# Patient Record
Sex: Female | Born: 1980 | Race: Black or African American | Hispanic: No | Marital: Married | Smoking: Never smoker
Health system: Southern US, Community
[De-identification: ages and names within clinical notes are randomized; demographics above are authoritative.]

## PROBLEM LIST (undated history)

## (undated) ENCOUNTER — Inpatient Hospital Stay (HOSPITAL_COMMUNITY): Payer: Self-pay

## (undated) DIAGNOSIS — F329 Major depressive disorder, single episode, unspecified: Secondary | ICD-10-CM

## (undated) DIAGNOSIS — J45909 Unspecified asthma, uncomplicated: Secondary | ICD-10-CM

## (undated) DIAGNOSIS — O24419 Gestational diabetes mellitus in pregnancy, unspecified control: Secondary | ICD-10-CM

## (undated) DIAGNOSIS — F419 Anxiety disorder, unspecified: Secondary | ICD-10-CM

## (undated) DIAGNOSIS — Z124 Encounter for screening for malignant neoplasm of cervix: Secondary | ICD-10-CM

## (undated) DIAGNOSIS — I10 Essential (primary) hypertension: Secondary | ICD-10-CM

## (undated) DIAGNOSIS — T7840XA Allergy, unspecified, initial encounter: Secondary | ICD-10-CM

## (undated) DIAGNOSIS — E669 Obesity, unspecified: Secondary | ICD-10-CM

## (undated) DIAGNOSIS — F32A Depression, unspecified: Secondary | ICD-10-CM

## (undated) DIAGNOSIS — H9191 Unspecified hearing loss, right ear: Secondary | ICD-10-CM

## (undated) HISTORY — PX: DILATION AND CURETTAGE OF UTERUS: SHX78

## (undated) HISTORY — DX: Allergy, unspecified, initial encounter: T78.40XA

## (undated) HISTORY — DX: Encounter for screening for malignant neoplasm of cervix: Z12.4

## (undated) HISTORY — DX: Obesity, unspecified: E66.9

---
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
## 2009-12-26 ENCOUNTER — Emergency Department (HOSPITAL_COMMUNITY): Admission: EM | Admit: 2009-12-26 | Discharge: 2009-12-26 | Payer: Self-pay | Admitting: Emergency Medicine

## 2010-05-30 LAB — URINE MICROSCOPIC-ADD ON

## 2010-05-30 LAB — URINALYSIS, ROUTINE W REFLEX MICROSCOPIC
Glucose, UA: NEGATIVE mg/dL
Ketones, ur: NEGATIVE mg/dL
Leukocytes, UA: NEGATIVE
Nitrite: NEGATIVE
Protein, ur: NEGATIVE mg/dL

## 2010-11-13 ENCOUNTER — Emergency Department (HOSPITAL_COMMUNITY)
Admission: EM | Admit: 2010-11-13 | Discharge: 2010-11-13 | Disposition: A | Discharge status: Home / Self Care | Payer: Self-pay | Attending: Emergency Medicine | Admitting: Emergency Medicine

## 2010-11-13 ENCOUNTER — Emergency Department (HOSPITAL_COMMUNITY): Payer: Self-pay

## 2010-11-13 DIAGNOSIS — R12 Heartburn: Secondary | ICD-10-CM | POA: Insufficient documentation

## 2010-11-13 DIAGNOSIS — R071 Chest pain on breathing: Secondary | ICD-10-CM | POA: Insufficient documentation

## 2010-11-13 DIAGNOSIS — Z79899 Other long term (current) drug therapy: Secondary | ICD-10-CM | POA: Insufficient documentation

## 2010-11-13 DIAGNOSIS — F341 Dysthymic disorder: Secondary | ICD-10-CM | POA: Insufficient documentation

## 2010-11-13 DIAGNOSIS — K219 Gastro-esophageal reflux disease without esophagitis: Secondary | ICD-10-CM | POA: Insufficient documentation

## 2010-11-13 DIAGNOSIS — R1013 Epigastric pain: Secondary | ICD-10-CM | POA: Insufficient documentation

## 2010-11-13 DIAGNOSIS — R110 Nausea: Secondary | ICD-10-CM | POA: Insufficient documentation

## 2010-11-13 LAB — COMPREHENSIVE METABOLIC PANEL
ALT: 20 U/L (ref 0–35)
BUN: 9 mg/dL (ref 6–23)
CO2: 23 mEq/L (ref 19–32)
Calcium: 9.3 mg/dL (ref 8.4–10.5)
Creatinine, Ser: 0.7 mg/dL (ref 0.50–1.10)
GFR calc Af Amer: >60 mL/min (ref >60)
GFR calc non Af Amer: >60 mL/min (ref >60)
Glucose, Bld: 85 mg/dL (ref 70–99)
Sodium: 139 mEq/L (ref 135–145)

## 2010-11-13 LAB — LIPASE, BLOOD: Lipase: 30 U/L (ref 11–59)

## 2010-11-13 LAB — DIFFERENTIAL
Eosinophils Relative: 0 % (ref 0–5)
Lymphocytes Relative: 20 % (ref 12–46)
Lymphs Abs: 1.9 10*3/uL (ref 0.7–4.0)
Monocytes Absolute: 0.6 10*3/uL (ref 0.1–1.0)
Monocytes Relative: 6 % (ref 3–12)
Neutro Abs: 7 10*3/uL (ref 1.7–7.7)

## 2010-11-13 LAB — CBC
HCT: 40.2 % (ref 36.0–46.0)
Hemoglobin: 14.6 g/dL (ref 12.0–15.0)
MCH: 31.4 pg (ref 26.0–34.0)
MCHC: 36.3 g/dL — ABNORMAL HIGH (ref 30.0–36.0)
MCV: 86.5 fL (ref 78.0–100.0)
RDW: 11.9 % (ref 11.5–15.5)

## 2010-11-13 MED ORDER — IOHEXOL 300 MG/ML  SOLN
100.0000 mL | Freq: Once | INTRAMUSCULAR | Status: AC | PRN
  Administered 2010-11-13: 100 mL via INTRAVENOUS

## 2013-02-02 IMAGING — CT CT ANGIO CHEST
2 of 7 series · 19 of 46 positions shown · IV contrast (APPLIED)
Comparison: 11/13/2010

CLINICAL DATA: Chest pain, shortness of breath.

CT ANGIOGRAPHY CHEST WITH CONTRAST
TECHNIQUE: Multidetector CT imaging of the chest was performed
using the standard protocol during bolus administration of
intravenous contrast.  Multiplanar CT image reconstructions
including MIPs were obtained to evaluate the vascular anatomy.
Contrast:  100 ml Omnipaque 300 IV.

[Series 8: pulm embolism 1.0 b25f thin · axial · 0.69mm/px · z∈[-294,-54]mm · 16 of 265 slices shown]
[im 13/265  lung]
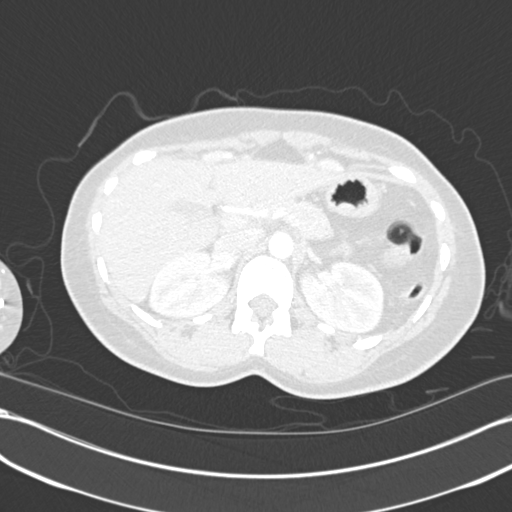
[im 25/265  soft-tissue]
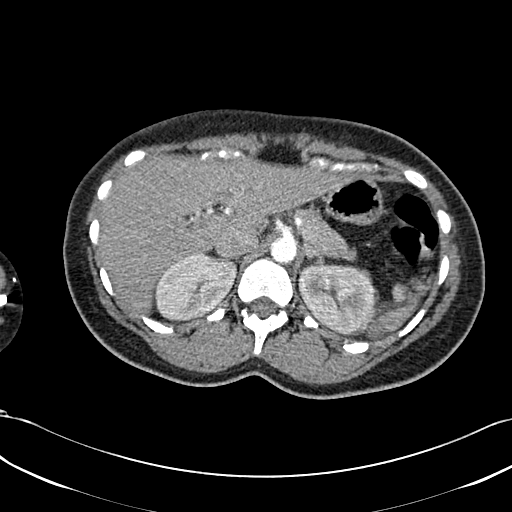
[im 49/265  lung]
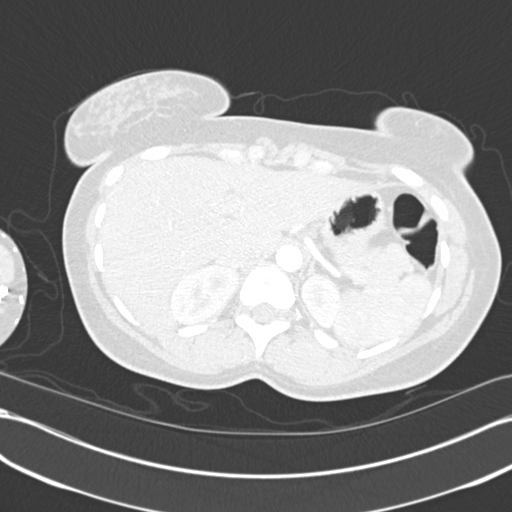
[im 61/265  soft-tissue]
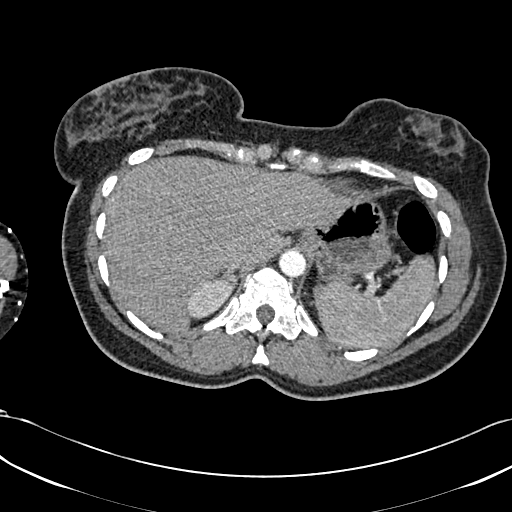
[im 73/265  lung]
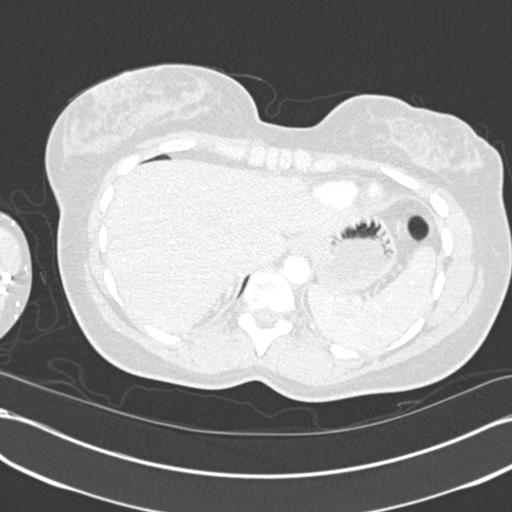
[im 97/265  soft-tissue]
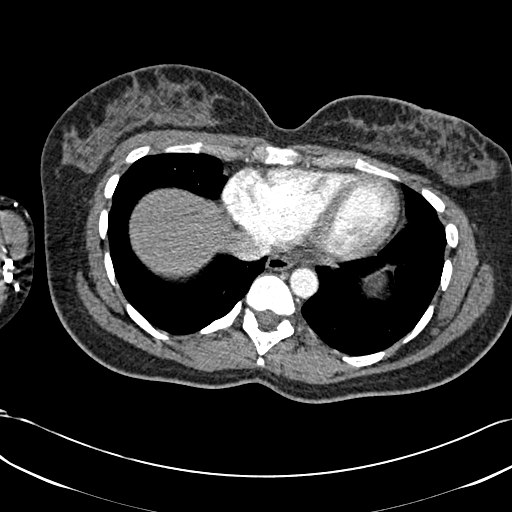
[im 109/265  lung]
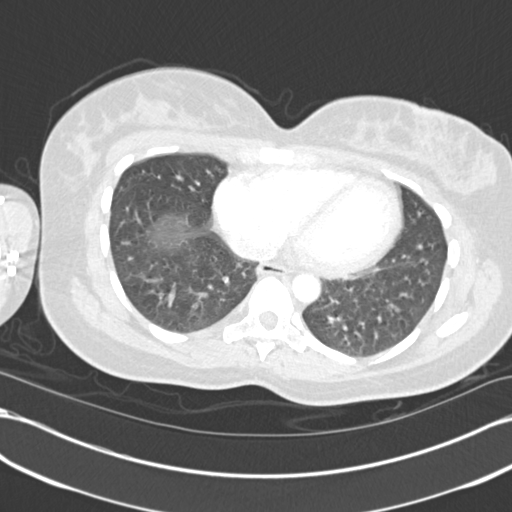
[im 121/265  soft-tissue]
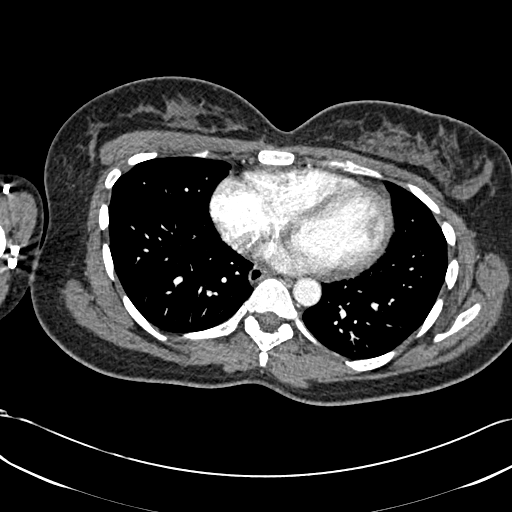
[im 145/265  lung]
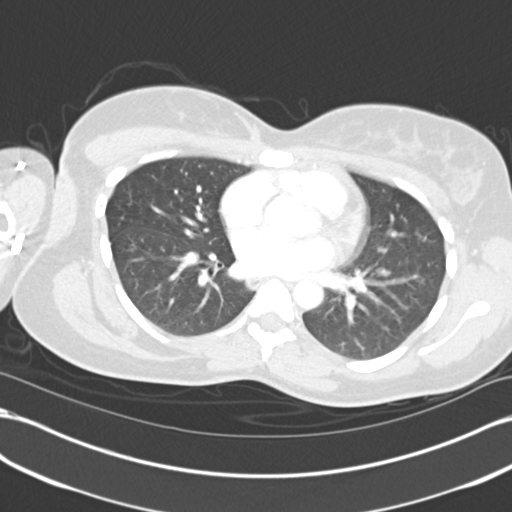
[im 157/265  soft-tissue]
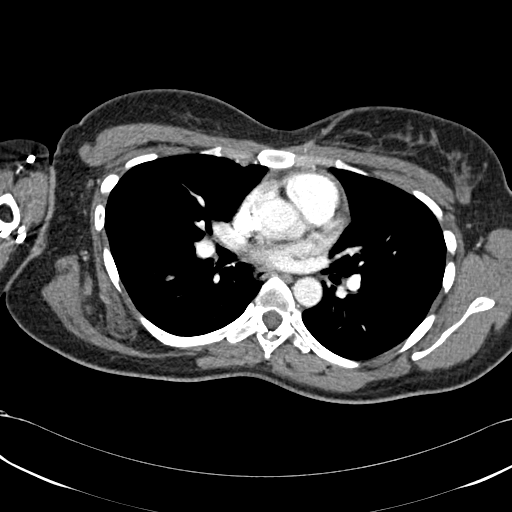
[im 169/265  lung]
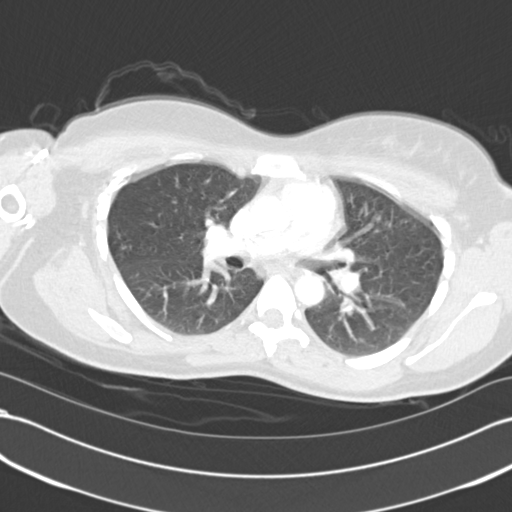
[im 193/265  soft-tissue]
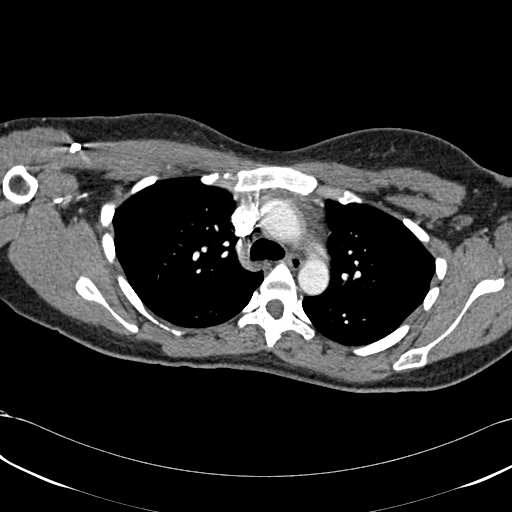
[im 205/265  lung]
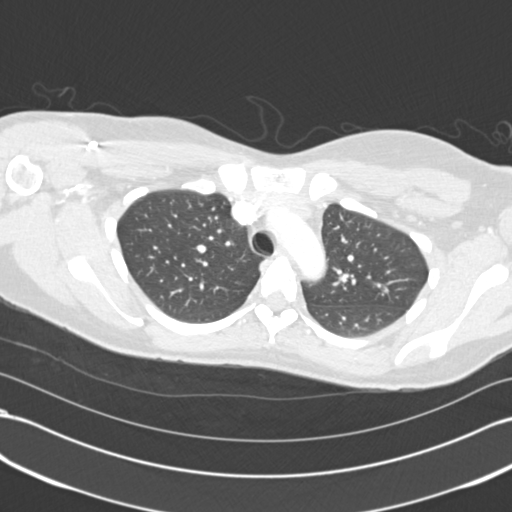
[im 217/265  soft-tissue]
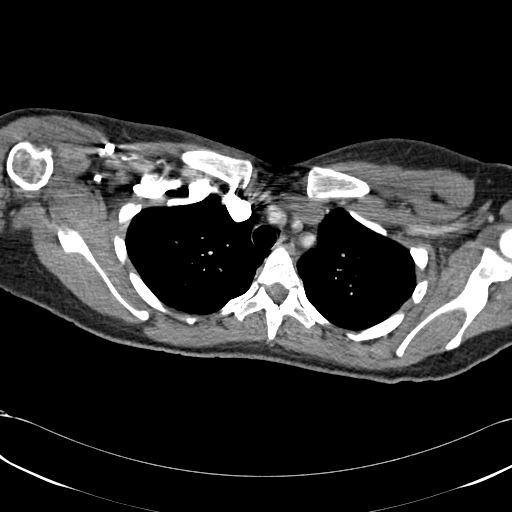
[im 241/265  lung]
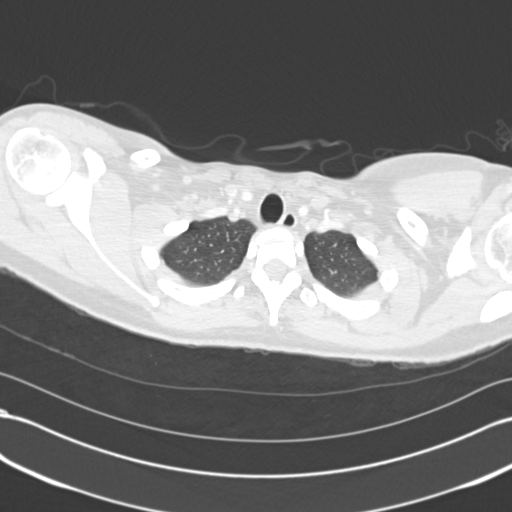
[im 253/265  soft-tissue]
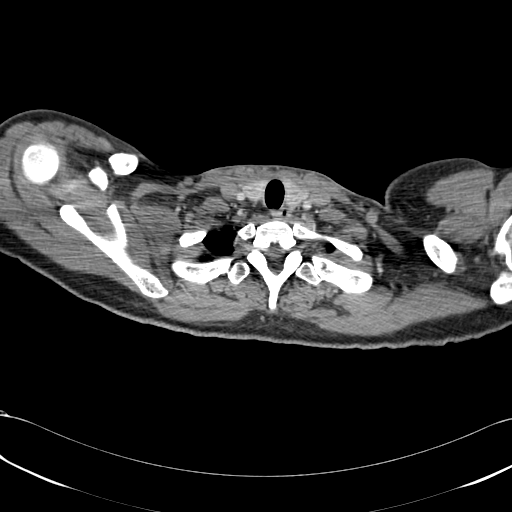

[Series 9: pulm embolism 2.0 spo thin · coronal · 0.69mm/px · 3 of 102 slices shown]
[im 26/102  soft-tissue]
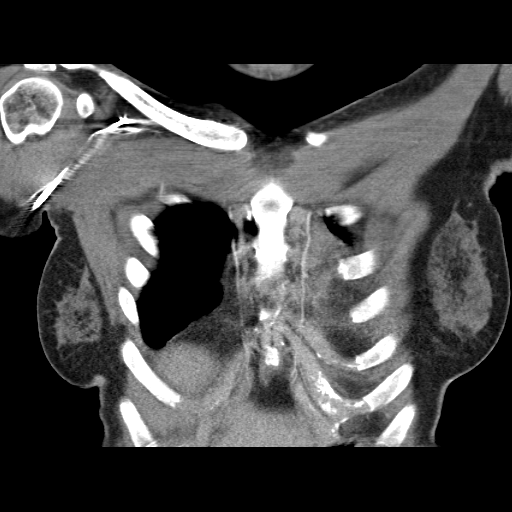
[im 51/102  soft-tissue]
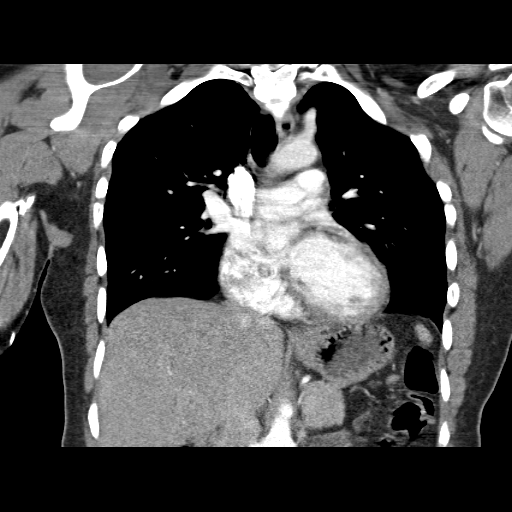
[im 76/102  soft-tissue]
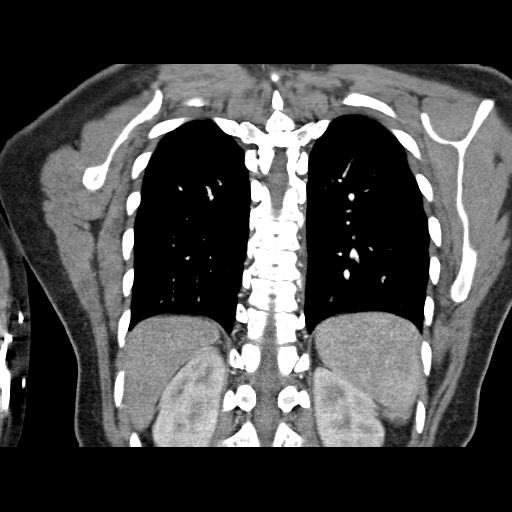

[19 of 46 positions shown; findings below may reference images not displayed]

FINDINGS: No filling defects in the pulmonary arteries to suggest
pulmonary emboli. Heart is normal size. Aorta is normal caliber. No
mediastinal, hilar, or axillary adenopathy.  Visualized thyroid and
chest wall soft tissues unremarkable. Lungs are clear.  No focal
airspace opacities or suspicious nodules.  No effusions. Imaging
into the upper abdomen shows no acute findings.

Review of the MIP images confirms the above findings.
IMPRESSION: No evidence of pulmonary embolus.  No acute findings.

## 2015-05-02 DIAGNOSIS — I10 Essential (primary) hypertension: Secondary | ICD-10-CM | POA: Insufficient documentation

## 2016-04-21 ENCOUNTER — Emergency Department (HOSPITAL_COMMUNITY)
Admission: EM | Admit: 2016-04-21 | Discharge: 2016-04-21 | Disposition: A | Discharge status: Home / Self Care | Payer: 59 | Attending: Emergency Medicine | Admitting: Emergency Medicine

## 2016-04-21 ENCOUNTER — Emergency Department (HOSPITAL_COMMUNITY): Payer: 59

## 2016-04-21 ENCOUNTER — Encounter (HOSPITAL_COMMUNITY): Payer: Self-pay | Admitting: Emergency Medicine

## 2016-04-21 DIAGNOSIS — R0789 Other chest pain: Secondary | ICD-10-CM | POA: Insufficient documentation

## 2016-04-21 DIAGNOSIS — R002 Palpitations: Secondary | ICD-10-CM | POA: Diagnosis present

## 2016-04-21 DIAGNOSIS — F419 Anxiety disorder, unspecified: Secondary | ICD-10-CM | POA: Insufficient documentation

## 2016-04-21 DIAGNOSIS — R079 Chest pain, unspecified: Secondary | ICD-10-CM

## 2016-04-21 LAB — BASIC METABOLIC PANEL
ANION GAP: 12 (ref 5–15)
BUN: 8 mg/dL (ref 6–20)
CHLORIDE: 99 mmol/L — AB (ref 101–111)
CO2: 24 mmol/L (ref 22–32)
Calcium: 9.2 mg/dL (ref 8.9–10.3)
Creatinine, Ser: 0.82 mg/dL (ref 0.44–1.00)
GFR calc Af Amer: >60 mL/min (ref >60)
GFR calc non Af Amer: >60 mL/min (ref >60)
GLUCOSE: 142 mg/dL — AB (ref 65–99)
POTASSIUM: 2.9 mmol/L — AB (ref 3.5–5.1)
Sodium: 135 mmol/L (ref 135–145)

## 2016-04-21 LAB — I-STAT TROPONIN, ED
TROPONIN I, POC: 0 ng/mL (ref 0.00–0.08)
Troponin i, poc: 0 ng/mL (ref 0.00–0.08)

## 2016-04-21 LAB — CBC
HEMATOCRIT: 36.9 % (ref 36.0–46.0)
HEMOGLOBIN: 12.9 g/dL (ref 12.0–15.0)
MCH: 29.9 pg (ref 26.0–34.0)
MCHC: 35 g/dL (ref 30.0–36.0)
MCV: 85.4 fL (ref 78.0–100.0)
Platelets: 276 10*3/uL (ref 150–400)
RBC: 4.32 MIL/uL (ref 3.87–5.11)
RDW: 12.5 % (ref 11.5–15.5)
WBC: 9.3 10*3/uL (ref 4.0–10.5)

## 2016-04-21 LAB — MAGNESIUM: MAGNESIUM: 1.8 mg/dL (ref 1.7–2.4)

## 2016-04-21 LAB — I-STAT BETA HCG BLOOD, ED (MC, WL, AP ONLY): HCG, QUANTITATIVE: 761.5 m[IU]/mL — AB (ref <5)

## 2016-04-21 MED ORDER — POTASSIUM CHLORIDE CRYS ER 20 MEQ PO TBCR: 20.0000 meq | EXTENDED_RELEASE_TABLET | Freq: Two times a day (BID) | ORAL | 0 refills | Status: DC

## 2016-04-21 MED ORDER — POTASSIUM CHLORIDE 10 MEQ/100ML IV SOLN
10.0000 meq | Freq: Once | INTRAVENOUS | Status: DC
  Filled 2016-04-21: qty 100

## 2016-04-21 MED ORDER — POTASSIUM CHLORIDE CRYS ER 20 MEQ PO TBCR
40.0000 meq | EXTENDED_RELEASE_TABLET | Freq: Once | ORAL | Status: AC
  Administered 2016-04-21: 40 meq via ORAL
  Filled 2016-04-21: qty 2

## 2016-04-21 NOTE — ED Notes (Signed)
Lab called for blood draw.

## 2016-04-21 NOTE — ED Triage Notes (Signed)
[facility] from [facility] [employer] [facility] with c/p palpations/CP starting approx 1 hour ago.  Pt states she has a hx of anxiety and is unsure if this is a panic attack.  She reports miscarriage several weeks ago with N/V associated with it but the CP is new.  Non radiation.  Took 325 mg aspirin PTA.  NAD, A&O.

## 2016-04-21 NOTE — ED Notes (Signed)
PT ambulates to restroom without difficulty. Steady gait

## 2016-04-21 NOTE — ED Notes (Signed)
This nurse attempts troponin draw without success.

## 2016-04-21 NOTE — ED Notes (Signed)
PT reports pain is now 2/10. PT reports pain has calmed significantly since arrival to ED. PT taken a glass of water. No further requests at this time. PT appears calm.

## 2016-04-21 NOTE — ED Notes (Signed)
Joselyn Glassmanyler PA made aware that IV could not be established and that PT is in hall bed and does not have access to central telemetry. He requests that PO potassium be given at this time.

## 2016-04-21 NOTE — ED Notes (Signed)
Liz BeachGabe, RN attempts IV without success

## 2016-04-21 NOTE — ED Notes (Signed)
Lab at bedside

## 2016-04-21 NOTE — ED Notes (Addendum)
Care handoff to [place], [place]. She is aware of potassium orders and will follow up with Joselyn Glassmanyler, PA about IV potassium

## 2016-04-21 NOTE — ED Notes (Signed)
Marcelino DusterMichelle RN made aware of IV team consult and IV potassium.

## 2016-04-21 NOTE — ED Provider Notes (Signed)
[facility]-EMERGENCY DEPT Provider Note   CSN: [id number] Arrival date & time: 04/21/16  1411     History   Chief Complaint Chief Complaint  Patient presents with  . Palpitations  . Anxiety    HPI Megan Fields is a 36 y.o. female.  36 year old African-American female with no significant past medical history presents to the ED today with complaints of palpitation and anxiety. Patient presents from work by EMS. Approximately 12 PM this afternoon patient developed substernal chest pain. She describes it as pressure. Lasted for approximately 5 seconds. Was not associated with exertion. Was not pleuritic in nature. The pain did not radiate. She states that she then had a 2 out of 10 feeling of "uncomfort" in her chest. The pain did not radiate. Patient states that she began to hyperventilate and have clammy hands. Which has since resolved. Patient states that she was not diaphoretic or short of breath with the episode. States that she has been nauseous but that is baseline since her miscarriage. Patient is currently going through a miscarriage at this time. She is followed by OB/GYN. She has weekly beta hCGs drawn. Patient states that last week her beta hCG was 2400. She endorses vaginal bleeding that is baseline for her. Patient was given 325 aspirin in route by EMS. On my assessment patient states that she has calmed down significantly since arrival to the ED. She doesn't drink water without any difficulties. States that her pain is better 2 out of 10. States she feels much improved. Movement or touching the area makes the pain worse. Nothing makes the pain better. Patient states she is under a lot of stress at this time. Her miscarriages given her a lot of anxiety. Patient states that she had a very similar episode 6 years ago with the same symptoms. She was diagnosed with anxiety and panic attack at that time. States this feels very similar. She denies any fever, chills, headache, vision changes,  lightheadedness, dizziness, abdominal pain, urinary symptoms, change in bowel habits, paresthesias. She denies any unilateral leg swelling, calf tenderness, history of DVT, prolonged immobilizations, recent hospitalizations/surgeries. She denies any family history of cardiac disease. She denies any cardiac disease. She denies any history of hypertension, hyperlipidemia. She does not smoke.      No past medical history on file.  There are no active problems to display for this patient.   No past surgical history on file.  OB History    No data available       Home Medications    Prior to Admission medications   Not on File    Family History No family history on file.  Social History Social History  Substance Use Topics  . Smoking status: Never Smoker  . Smokeless tobacco: Never Used  . Alcohol use No     Allergies   Patient has no allergy information on record.   Review of Systems Review of Systems  Constitutional: Negative for chills and fever.  HENT: Negative for congestion.   Eyes: Negative for visual disturbance.  Respiratory: Negative for cough and shortness of breath.   Cardiovascular: Positive for chest pain. Negative for palpitations and leg swelling.  Gastrointestinal: Negative for abdominal pain, diarrhea, nausea and vomiting.  Genitourinary: Negative for dysuria, flank pain, frequency, hematuria and urgency.  Musculoskeletal: Negative.   Skin: Negative.   Neurological: Negative for dizziness, syncope, weakness, light-headedness and headaches.  All other systems reviewed and are negative.    Physical Exam Updated Vital Signs BP 128/91 (  BP Location: Left Arm)   Pulse 89   Temp 99.6 F (37.6 C) (Oral)   Resp 17   Ht 5\' 3"  (1.6 m)   Wt 82.6 kg   SpO2 99%   BMI 32.24 kg/m   Physical Exam  Constitutional: She is oriented to person, place, and time. She appears well-developed and well-nourished. No distress.  Patient is nontoxic appearing.  She is asking for water. She feels much relieved after arriving to the ED.  HENT:  Head: Normocephalic and atraumatic.  Mouth/Throat: Oropharynx is clear and moist.  Eyes: Conjunctivae and EOM are normal. Pupils are equal, round, and reactive to light. Right eye exhibits no discharge. Left eye exhibits no discharge. No scleral icterus.  Neck: Normal range of motion. Neck supple. No thyromegaly present.  Cardiovascular: Normal rate, regular rhythm, normal heart sounds and intact distal pulses.  Exam reveals no gallop and no friction rub.   No murmur heard. Pulmonary/Chest: Effort normal and breath sounds normal. She exhibits tenderness (anterior chest wall to palpation).  Clear to auscultation bilaterally. Patient is not hypoxic or tachypnea.  Abdominal: Soft. Bowel sounds are normal. She exhibits no distension. There is no tenderness. There is no rebound and no guarding.  Musculoskeletal: Normal range of motion.  Lymphadenopathy:    She has no cervical adenopathy.  Neurological: She is alert and oriented to person, place, and time.  Skin: Skin is warm and dry. Capillary refill takes less than 2 seconds.  Nursing note and vitals reviewed.    ED Treatments / Results  Labs (all labs ordered are listed, but only abnormal results are displayed) Labs Reviewed  BASIC METABOLIC PANEL - Abnormal; Notable for the following:       Result Value   Potassium 2.9 (*)    Chloride 99 (*)    Glucose, Bld 142 (*)    All other components within normal limits  I-STAT BETA HCG BLOOD, ED ([facility], [facility], [facility] ONLY) - Abnormal; Notable for the following:    I-stat hCG, quantitative 761.5 (*)    All other components within normal limits  CBC  MAGNESIUM  I-STAT TROPOININ, ED  I-STAT TROPOININ, ED    EKG  EKG Interpretation None       Radiology Dg Chest 2 View  Result Date: 04/21/2016 CLINICAL DATA:  Tachycardia, palpitations, left-sided chest pain associated with nausea and diarrhea. History of  hypertension and childhood asthma. Nonsmoker. EXAM: CHEST  2 VIEW COMPARISON:  Chest x-ray and chest CT scan of November 13, 2010 FINDINGS: The lungs are adequately inflated and clear. The heart and pulmonary vascularity are normal. The mediastinum is normal in width. There is no pleural effusion. There is calcification in the wall of the aortic arch. The bony thorax exhibits no acute abnormality. IMPRESSION: There is no acute cardiopulmonary disease. Thoracic aortic atherosclerosis. Electronically Signed   By: David  [place] M.D.   On: 04/21/2016 15:19    Procedures Procedures (including critical care time)  Medications Ordered in ED Medications  potassium chloride SA (K-DUR,KLOR-CON) CR tablet 40 mEq (40 mEq Oral Given 04/21/16 1920)     Initial Impression / Assessment and Plan / ED Course  I have reviewed the triage vital signs and the nursing notes.  Pertinent labs & imaging results that were available during my care of the patient were reviewed by me and considered in my medical decision making (see chart for details).     The patient presents to the ED with complaint of chest pain and palpitations.  States that she felt very anxious to work and was thought she was having a panic attack. Patient states her palms became very sweaty and she was hyperventilating. Her symptoms have since resolved after arriving to the ED. States she feels more calm at this time. Rates her pain a 2 out of 10. She does have tenderness to palpation of the anterior chest wall. Delta troponins have been negative. EKG is normal sinus rhythm as reviewed by myself. The patient is PERC negative. Low suspicion for ACS or PE at this time. Patient is not hypoxic or tachypneic. She was noted to have a potassium of 2.9. This is likely from hyperventilating. Nurse unable to obtain IV access. Patient was given oral potassium. Discussed with Dr. Effie Shy replaced with IV. He recommends that patient can be replaced with oral potassium go  home with 3 days of oral potassium. Will need follow-up with primary care doctor to recheck potassium. Patient otherwise hemodynamically stable. All the labs unremarkable. Her magnesium was normal. Patient does have a beta hCG is 700. She is currently going through a miscarriage and follow OB/GYN. States her last Quant was 2400 last week. This is decreasing. She denies any abdominal complaints at this time. Patient is afebrile. Patient states she has history of panic attacks and anxiety and this feels very similar. Chest pain is not likely of cardiac or pulmonary etiology d/t presentation, perc negative, VSS, no tracheal deviation, no JVD or new murmur, RRR, breath sounds equal bilaterally, EKG without acute abnormalities, negative troponin, and negative CXR. Pt has been advised to return to the ED is CP becomes exertional, associated with diaphoresis or nausea, radiates to left jaw/arm, worsens or becomes concerning in any way. Pt appears reliable for follow up and is agreeable to discharge. I encouraged patient to follow up with her primary care doctor for further workup. Discussed patient with Dr. Effie Shy who agrees with the above plan. Pt is hemodynamically stable, in NAD, & able to ambulate in the ED. Pain has been managed & has no complaints prior to dc. Pt is comfortable with above plan and is stable for discharge at this time. All questions were answered prior to disposition. Strict return precautions for f/u to the ED were discussed.   Final Clinical Impressions(s) / ED Diagnoses   Final diagnoses:  Palpitations  Chest pain, unspecified type    New Prescriptions Discharge Medication List as of 04/21/2016  8:23 PM    START taking these medications   Details  potassium chloride SA (K-DUR,KLOR-CON) 20 MEQ tablet Take 1 tablet (20 mEq total) by mouth 2 (two) times daily., Starting Mon 04/21/2016, Print         Rise Mu, PA-C 04/22/16 0957    Mancel Bale, MD 04/23/16 1538

## 2016-04-21 NOTE — Discharge Instructions (Signed)
Please take the potassium supplements 2 times a day for the next 3 days. Have her potassium rechecked by her primary care doctor. Follow-up with her OB/GYN for your regular blood tests. Motrin and Tylenol for pain. Please return to ED if you develop any worsening exertional chest pain, shortness of breath, fevers or for any reason.

## 2016-04-25 DIAGNOSIS — F419 Anxiety disorder, unspecified: Secondary | ICD-10-CM | POA: Insufficient documentation

## 2016-05-20 NOTE — H&P (Addendum)
Megan Fields is a 36 y.o. female presenting for scheduled Suction D&E for incomplete ab. Pt was dx with a missed ab 03/26/16 via US. After a course of two doses of cytotec 600mg  her quants dropped from 81000 to 2600. A subsequent US noted persistence of the GA. She did a second course of two doses of cytotec again and quants dropped down to 200 however GS still noted. Pt now opts for surgical intervention to complete ab  OB History    No data available     No past medical history on file. No past surgical history on file. Family History: family history is not on file. Social History:  reports that she has never smoked. She has never used smokeless tobacco. She reports that she does not drink alcohol or use drugs.     Maternal Diabetes: No Maternal Ultrasounds/Referrals: Abnormal:  Findings:   Other:missed ab Maternal Substance Abuse:  No Significant Maternal Medications:  None Significant Maternal Lab Results:  Lab values include: Other:  quant hcg 202 Other Comments:  None  Review of Systems  Constitutional: Negative for chills, fever, malaise/fatigue and weight loss.  Eyes: Negative for blurred vision.  Respiratory: Negative for shortness of breath.   Cardiovascular: Negative for chest pain.  Gastrointestinal: Negative for abdominal pain, nausea and vomiting.  Genitourinary: Negative for dysuria.  Musculoskeletal: Negative for myalgias.  Skin: Negative for itching and rash.  Neurological: Negative for dizziness and headaches.  Psychiatric/Behavioral: Negative for depression, hallucinations, substance abuse and suicidal ideas. The patient is nervous/anxious.    Maternal Medical History:  Reason for admission: Nausea.      There were no vitals taken for this visit. Exam Physical Exam  Prenatal labs: ABO, Rh:   Antibody:   Rubella:   RPR:    HBsAg:    HIV:    GBS:     Assessment/Plan: G2P1011 with incomplete missed ab for Suction D&E under ultrasound guidance.  R/B  reviewed and all questions answered Doxycycline pre and post op To OR when ready   Edwinna AreolaCecilia Worema Valeriano Bain 05/20/2016, 11:50 AM

## 2016-05-21 ENCOUNTER — Ambulatory Visit (HOSPITAL_COMMUNITY): Payer: 59 | Admitting: Anesthesiology

## 2016-05-21 ENCOUNTER — Ambulatory Visit (HOSPITAL_COMMUNITY)
Admission: RE | Admit: 2016-05-21 | Discharge: 2016-05-21 | Disposition: A | Discharge status: Home / Self Care | Payer: 59 | Source: Ambulatory Visit | Attending: Obstetrics and Gynecology | Admitting: Obstetrics and Gynecology

## 2016-05-21 ENCOUNTER — Encounter (HOSPITAL_COMMUNITY): Payer: Self-pay

## 2016-05-21 ENCOUNTER — Ambulatory Visit (HOSPITAL_COMMUNITY): Payer: 59

## 2016-05-21 ENCOUNTER — Encounter (HOSPITAL_COMMUNITY)
Admission: RE | Disposition: A | Discharge status: Home / Self Care | Payer: Self-pay | Source: Ambulatory Visit | Attending: Obstetrics and Gynecology

## 2016-05-21 DIAGNOSIS — I10 Essential (primary) hypertension: Secondary | ICD-10-CM | POA: Diagnosis not present

## 2016-05-21 DIAGNOSIS — Z79899 Other long term (current) drug therapy: Secondary | ICD-10-CM | POA: Insufficient documentation

## 2016-05-21 DIAGNOSIS — O021 Missed abortion: Secondary | ICD-10-CM

## 2016-05-21 DIAGNOSIS — O029 Abnormal product of conception, unspecified: Secondary | ICD-10-CM

## 2016-05-21 DIAGNOSIS — Z9889 Other specified postprocedural states: Secondary | ICD-10-CM

## 2016-05-21 HISTORY — DX: Anxiety disorder, unspecified: F41.9

## 2016-05-21 HISTORY — DX: Major depressive disorder, single episode, unspecified: F32.9

## 2016-05-21 HISTORY — PX: DILATION AND EVACUATION: SHX1459

## 2016-05-21 HISTORY — DX: Depression, unspecified: F32.A

## 2016-05-21 LAB — CBC
HEMATOCRIT: 40.2 % (ref 36.0–46.0)
HEMOGLOBIN: 14 g/dL (ref 12.0–15.0)
MCH: 30.4 pg (ref 26.0–34.0)
MCHC: 34.8 g/dL (ref 30.0–36.0)
MCV: 87.2 fL (ref 78.0–100.0)
Platelets: 318 10*3/uL (ref 150–400)
RBC: 4.61 MIL/uL (ref 3.87–5.11)
RDW: 12.9 % (ref 11.5–15.5)
WBC: 6.8 10*3/uL (ref 4.0–10.5)

## 2016-05-21 LAB — ABO/RH: ABO/RH(D): B POS

## 2016-05-21 LAB — BASIC METABOLIC PANEL
Anion gap: 7 (ref 5–15)
BUN: 12 mg/dL (ref 6–20)
CO2: 26 mmol/L (ref 22–32)
Calcium: 9 mg/dL (ref 8.9–10.3)
Chloride: 103 mmol/L (ref 101–111)
Creatinine, Ser: 0.79 mg/dL (ref 0.44–1.00)
GFR calc Af Amer: >60 mL/min (ref >60)
Glucose, Bld: 112 mg/dL — ABNORMAL HIGH (ref 65–99)
Potassium: 3.8 mmol/L (ref 3.5–5.1)
SODIUM: 136 mmol/L (ref 135–145)

## 2016-05-21 LAB — TYPE AND SCREEN
ABO/RH(D): B POS
ANTIBODY SCREEN: NEGATIVE

## 2016-05-21 SURGERY — DILATION AND EVACUATION, UTERUS
Anesthesia: Monitor Anesthesia Care | Site: Vagina

## 2016-05-21 MED ORDER — LIDOCAINE HCL 1 % IJ SOLN
INTRAMUSCULAR | Status: DC | PRN
  Administered 2016-05-21: 10 mL

## 2016-05-21 MED ORDER — DOXYCYCLINE HYCLATE 100 MG PO TABS: 100.0000 mg | ORAL_TABLET | Freq: Once | ORAL | Status: DC

## 2016-05-21 MED ORDER — HYDROCODONE-ACETAMINOPHEN 5-325 MG PO TABS: 1.0000 | ORAL_TABLET | ORAL | Status: DC | PRN

## 2016-05-21 MED ORDER — PROPOFOL 500 MG/50ML IV EMUL
INTRAVENOUS | Status: DC | PRN
  Administered 2016-05-21: 150 ug/kg/min via INTRAVENOUS

## 2016-05-21 MED ORDER — PROMETHAZINE HCL 25 MG/ML IJ SOLN: 6.2500 mg | INTRAMUSCULAR | Status: DC | PRN

## 2016-05-21 MED ORDER — LIDOCAINE HCL (CARDIAC) 20 MG/ML IV SOLN
INTRAVENOUS | Status: AC
  Filled 2016-05-21: qty 5

## 2016-05-21 MED ORDER — MIDAZOLAM HCL 2 MG/2ML IJ SOLN
INTRAMUSCULAR | Status: AC
  Filled 2016-05-21: qty 2

## 2016-05-21 MED ORDER — KETOROLAC TROMETHAMINE 30 MG/ML IJ SOLN
INTRAMUSCULAR | Status: DC | PRN
  Administered 2016-05-21: 30 mg via INTRAVENOUS

## 2016-05-21 MED ORDER — LACTATED RINGERS IV SOLN
INTRAVENOUS | Status: DC
  Administered 2016-05-21: 50 mL/h via INTRAVENOUS

## 2016-05-21 MED ORDER — DOXYCYCLINE HYCLATE 100 MG IV SOLR
100.0000 mg | Freq: Two times a day (BID) | INTRAVENOUS | Status: DC
  Filled 2016-05-21 (×3): qty 100

## 2016-05-21 MED ORDER — IBUPROFEN 200 MG PO TABS: 600.0000 mg | ORAL_TABLET | Freq: Four times a day (QID) | ORAL | 1 refills | Status: DC | PRN

## 2016-05-21 MED ORDER — FENTANYL CITRATE (PF) 100 MCG/2ML IJ SOLN: 25.0000 ug | INTRAMUSCULAR | Status: DC | PRN

## 2016-05-21 MED ORDER — ONDANSETRON HCL 4 MG/2ML IJ SOLN
INTRAMUSCULAR | Status: AC
  Filled 2016-05-21: qty 2

## 2016-05-21 MED ORDER — PROPOFOL 10 MG/ML IV BOLUS
INTRAVENOUS | Status: AC
  Filled 2016-05-21: qty 20

## 2016-05-21 MED ORDER — FENTANYL CITRATE (PF) 100 MCG/2ML IJ SOLN
INTRAMUSCULAR | Status: DC | PRN
  Administered 2016-05-21 (×2): 50 ug via INTRAVENOUS

## 2016-05-21 MED ORDER — DEXAMETHASONE SODIUM PHOSPHATE 10 MG/ML IJ SOLN
INTRAMUSCULAR | Status: DC | PRN
  Administered 2016-05-21: 4 mg via INTRAVENOUS

## 2016-05-21 MED ORDER — KETOROLAC TROMETHAMINE 30 MG/ML IJ SOLN: 30.0000 mg | Freq: Once | INTRAMUSCULAR | Status: DC | PRN

## 2016-05-21 MED ORDER — DEXAMETHASONE SODIUM PHOSPHATE 4 MG/ML IJ SOLN
INTRAMUSCULAR | Status: AC
  Filled 2016-05-21: qty 1

## 2016-05-21 MED ORDER — KETOROLAC TROMETHAMINE 30 MG/ML IJ SOLN: 30.0000 mg | Freq: Once | INTRAMUSCULAR | Status: DC

## 2016-05-21 MED ORDER — FENTANYL CITRATE (PF) 100 MCG/2ML IJ SOLN
INTRAMUSCULAR | Status: AC
Start: 2016-05-21 — End: ?
  Filled 2016-05-21: qty 2

## 2016-05-21 MED ORDER — SCOPOLAMINE 1 MG/3DAYS TD PT72
1.0000 | MEDICATED_PATCH | Freq: Once | TRANSDERMAL | Status: DC
  Administered 2016-05-21: 1.5 mg via TRANSDERMAL

## 2016-05-21 MED ORDER — MIDAZOLAM HCL 2 MG/2ML IJ SOLN
INTRAMUSCULAR | Status: DC | PRN
  Administered 2016-05-21: 2 mg via INTRAVENOUS

## 2016-05-21 MED ORDER — HYDROCODONE-ACETAMINOPHEN 5-325 MG PO TABS: 1.0000 | ORAL_TABLET | ORAL | 0 refills | Status: DC | PRN

## 2016-05-21 MED ORDER — SCOPOLAMINE 1 MG/3DAYS TD PT72
MEDICATED_PATCH | TRANSDERMAL | Status: AC
  Filled 2016-05-21: qty 1

## 2016-05-21 MED ORDER — DOXYCYCLINE HYCLATE 100 MG IV SOLR
200.0000 mg | Freq: Once | INTRAVENOUS | Status: AC
  Administered 2016-05-21: 200 mg via INTRAVENOUS
  Filled 2016-05-21: qty 200

## 2016-05-21 MED ORDER — LIDOCAINE HCL (CARDIAC) 20 MG/ML IV SOLN
INTRAVENOUS | Status: DC | PRN
  Administered 2016-05-21: 50 mg via INTRAVENOUS

## 2016-05-21 MED ORDER — ONDANSETRON HCL 4 MG/2ML IJ SOLN
INTRAMUSCULAR | Status: DC | PRN
  Administered 2016-05-21: 4 mg via INTRAVENOUS

## 2016-05-21 SURGICAL SUPPLY — 19 items
CATH ROBINSON RED A/P 16FR (CATHETERS) ×3 IMPLANT
CLOTH BEACON ORANGE TIMEOUT ST (SAFETY) ×3 IMPLANT
CONTAINER PREFILL 10% NBF 60ML (FORM) ×6 IMPLANT
GLOVE BIO SURGEON STRL SZ 6.5 (GLOVE) ×2 IMPLANT
GLOVE BIO SURGEONS STRL SZ 6.5 (GLOVE) ×1
GLOVE BIOGEL PI IND STRL 7.0 (GLOVE) ×1 IMPLANT
GLOVE BIOGEL PI INDICATOR 7.0 (GLOVE) ×2
GOWN STRL REUS W/TWL LRG LVL3 (GOWN DISPOSABLE) ×6 IMPLANT
KIT BERKELEY 1ST TRIMESTER 3/8 (MISCELLANEOUS) ×3 IMPLANT
PACK VAGINAL MINOR WOMEN LF (CUSTOM PROCEDURE TRAY) ×3 IMPLANT
PAD OB MATERNITY 4.3X12.25 (PERSONAL CARE ITEMS) ×3 IMPLANT
PAD PREP 24X48 CUFFED NSTRL (MISCELLANEOUS) ×3 IMPLANT
SET BERKELEY SUCTION TUBING (SUCTIONS) ×3 IMPLANT
TOWEL OR 17X24 6PK STRL BLUE (TOWEL DISPOSABLE) ×6 IMPLANT
VACURETTE 10 RIGID CVD (CANNULA) IMPLANT
VACURETTE 7MM CVD STRL WRAP (CANNULA) IMPLANT
VACURETTE 8 RIGID CVD (CANNULA) ×3 IMPLANT
VACURETTE 9 RIGID CVD (CANNULA) IMPLANT
WATER STERILE IRR 1000ML POUR (IV SOLUTION) ×3 IMPLANT

## 2016-05-21 NOTE — Discharge Instructions (Signed)
DISCHARGE INSTRUCTIONS: D&E The following instructions have been prepared to help you care for yourself upon your return home.   **You may begin taking Ibuprofen(Motrin, Advil) after 3:11 pm today**  Personal hygiene:  Use sanitary pads for vaginal drainage, not tampons.  Shower the day after your procedure.  NO tub baths, pools or Jacuzzis for 2-3 weeks.  Wipe front to back after using the bathroom.  Activity and limitations:  Do NOT drive or operate any equipment for 24 hours. The effects of anesthesia are still present and drowsiness may result.  Do NOT rest in bed all day.  Walking is encouraged.  Walk up and down stairs slowly.  You may resume your normal activity in one to two days or as indicated by your physician.  Sexual activity: NO intercourse until after your post-op visit.  Diet: Eat a light meal as desired this evening. You may resume your usual diet tomorrow.  Return to work: You may resume your work activities in one to two days or as indicated by your doctor.  What to expect after your surgery: Expect to have vaginal bleeding/discharge for 2-3 days and spotting for up to 10 days. It is not unusual to have soreness for up to 1-2 weeks. You may have a slight burning sensation when you urinate for the first day. Mild cramps may continue for a couple of days. You may have a regular period in 2-6 weeks.  Call your doctor for any of the following:  Excessive vaginal bleeding, saturating and changing one pad every hour.  Inability to urinate 6 hours after discharge from hospital.  Pain not relieved by pain medication.  Fever of 100.4 F or greater.  Unusual vaginal discharge or odor.    Patients signature: ______________________  Nurses signature ________________________  Support person's signature_______________________

## 2016-05-21 NOTE — Anesthesia Preprocedure Evaluation (Addendum)
Anesthesia Evaluation  Patient identified by MRN, date of birth, ID band Patient awake    Reviewed: Allergy & Precautions, NPO status , Patient's Chart, lab work & pertinent test results  Airway Mallampati: II  TM Distance: >3 FB Neck ROM: Full    Dental  (+) Dental Advisory Given   Pulmonary neg pulmonary ROS,    breath sounds clear to auscultation       Cardiovascular hypertension, Pt. on medications  Rhythm:Regular Rate:Normal     Neuro/Psych negative neurological ROS     GI/Hepatic negative GI ROS, Neg liver ROS,   Endo/Other  negative endocrine ROS  Renal/GU negative Renal ROS     Musculoskeletal   Abdominal   Peds  Hematology negative hematology ROS (+)   Anesthesia Other Findings   Reproductive/Obstetrics Missed AB                            Anesthesia Physical Anesthesia Plan  ASA: II  Anesthesia Plan: MAC   Post-op Pain Management:    Induction: Intravenous  Airway Management Planned: Natural Airway and Simple Face Mask  Additional Equipment:   Intra-op Plan:   Post-operative Plan:   Informed Consent: I have reviewed the patients History and Physical, chart, labs and discussed the procedure including the risks, benefits and alternatives for the proposed anesthesia with the patient or authorized representative who has indicated his/her understanding and acceptance.   Dental advisory given  Plan Discussed with: CRNA  Anesthesia Plan Comments:        Anesthesia Quick Evaluation

## 2016-05-21 NOTE — Interval H&P Note (Signed)
History and Physical Interval Note:  05/21/2016 8:26 AM  Megan Fields  has presented today for surgery, with the diagnosis of missed ab  The various methods of treatment have been discussed with the patient and family. After consideration of risks, benefits and other options for treatment, the patient has consented to  Procedure(s): DILATATION AND EVACUATION WITH ULTRASOUND GUIDANCE (N/A) as a surgical intervention .  The patient's history has been reviewed, patient examined, no change in status, stable for surgery.  I have reviewed the patient's chart and labs.  Questions were answered to the patient's satisfaction.     Cecilia [facility] Health SolutionsWorema Banga

## 2016-05-21 NOTE — Anesthesia Postprocedure Evaluation (Signed)
Anesthesia Post Note  Patient: Megan Fields  Procedure(s) Performed: Procedure(s) (LRB): DILATATION AND EVACUATION WITH ULTRASOUND GUIDANCE (N/A)  Patient location during evaluation: PACU Anesthesia Type: MAC Level of consciousness: awake and alert Pain management: pain level controlled Vital Signs Assessment: post-procedure vital signs reviewed and stable Respiratory status: spontaneous breathing, nonlabored ventilation, respiratory function stable and patient connected to nasal cannula oxygen Cardiovascular status: stable and blood pressure returned to baseline Anesthetic complications: no        Last Vitals:  Vitals:   05/21/16 0802 05/21/16 0930  BP: (!) 137/95 111/81  Pulse: 92   Resp: 16   Temp: 36.9 C 36.6 C    Last Pain:  Vitals:   05/21/16 0930  TempSrc:   PainSc: 2    Pain Goal: Patients Stated Pain Goal: 6 (05/21/16 0802)               Tiajuana Amass

## 2016-05-21 NOTE — Transfer of Care (Signed)
Immediate Anesthesia Transfer of Care Note  Patient: Megan Fields  Procedure(s) Performed: Procedure(s): DILATATION AND EVACUATION WITH ULTRASOUND GUIDANCE (N/A)  Patient Location: PACU  Anesthesia Type:MAC  Level of Consciousness: awake, alert  and oriented  Airway & Oxygen Therapy: Patient Spontanous Breathing and Patient connected to face mask oxygen  Post-op Assessment: Report given to RN and Post -op Vital signs reviewed and stable  Post vital signs: Reviewed and stable  Last Vitals:  Vitals:   05/21/16 0802  BP: (!) 137/95  Pulse: 92  Resp: 16  Temp: 36.9 C    Last Pain:  Vitals:   05/21/16 0802  TempSrc: Oral      Patients Stated Pain Goal: 6 (77/93/96 8864)  Complications: No apparent anesthesia complications

## 2016-05-21 NOTE — Op Note (Signed)
Operative Note    Preoperative Diagnosis: incomplete missed ab   Postoperative Diagnosis: same   Procedure: Suction Dilation and evacuation with ultrasound assistance   Surgeon: C.Alante Tolan DO  Anesthesia: MAC  Fluids: LR at 50cc EBL: <10cc UOP: 25cc IVF: 650cc  Findings: Moderate amount of products of conception; Uterus measuring 10cm   Specimen: products of conception to pathology   Procedure Note Consent was verified with pt perioperatively Patient was taken to the operating room where MAC anesthesia was obtained without difficulty. She was then prepped with soap and draped in the normal sterile fashion in the dorsal lithotomy position. An appropriate time out was performed. An exam under anesthesia noted a uterus in midposition.  A speculum was then placed within the vagina and the anterior lip of the cervix identified and grasped with a single toothed tenaculum. Uterus was then sounded to 10cm.  The Pratt dilators were utilized to dilate the cervix up to approximately 25. A size 8 curved cannula was then inserted into the uterine cavity and suction begun. There was moderate amount of products evacuated. After 4-6 passes, a sharp curettage was then performed. Four more passes with the suction cannula were done. Minimal amount of products were returned this time. . A transvaginal ultrasound was then performed and no gestational sac noted and only a scant amount of products.  A final currettage noted a gritty texture in all four quadrantsHence all instruments were removed from the vagina.  The tenaculum site was noted to be hemostatic.  There was scant to no bleeding from cervix. The speculum was removed from the vagina and the patient awakened and taken to the recovery room in good condition.  Counts correct per nursing staff

## 2016-05-22 ENCOUNTER — Encounter (HOSPITAL_COMMUNITY): Payer: Self-pay | Admitting: Obstetrics and Gynecology

## 2017-03-04 LAB — OB RESULTS CONSOLE GC/CHLAMYDIA
CHLAMYDIA, DNA PROBE: NEGATIVE
Gonorrhea: NEGATIVE

## 2017-03-04 LAB — OB RESULTS CONSOLE RPR: RPR: NONREACTIVE

## 2017-03-04 LAB — OB RESULTS CONSOLE HEPATITIS B SURFACE ANTIGEN: HEP B S AG: NEGATIVE

## 2017-03-04 LAB — OB RESULTS CONSOLE RUBELLA ANTIBODY, IGM: RUBELLA: IMMUNE

## 2017-03-04 LAB — OB RESULTS CONSOLE ABO/RH: RH TYPE: POSITIVE

## 2017-03-04 LAB — OB RESULTS CONSOLE ANTIBODY SCREEN: ANTIBODY SCREEN: NEGATIVE

## 2017-03-04 LAB — OB RESULTS CONSOLE HIV ANTIBODY (ROUTINE TESTING): HIV: NONREACTIVE

## 2017-03-18 ENCOUNTER — Other Ambulatory Visit: Payer: Self-pay | Admitting: Obstetrics and Gynecology

## 2017-03-18 DIAGNOSIS — N63 Unspecified lump in unspecified breast: Secondary | ICD-10-CM

## 2017-03-18 DIAGNOSIS — N644 Mastodynia: Secondary | ICD-10-CM

## 2017-03-20 ENCOUNTER — Ambulatory Visit
Admission: RE | Admit: 2017-03-20 | Discharge: 2017-03-20 | Disposition: A | Discharge status: Home / Self Care | Payer: Managed Care, Other (non HMO) | Source: Ambulatory Visit | Attending: Obstetrics and Gynecology | Admitting: Obstetrics and Gynecology

## 2017-03-20 DIAGNOSIS — N644 Mastodynia: Secondary | ICD-10-CM

## 2017-03-20 DIAGNOSIS — N63 Unspecified lump in unspecified breast: Secondary | ICD-10-CM

## 2017-07-27 ENCOUNTER — Inpatient Hospital Stay (HOSPITAL_COMMUNITY)
Admission: AD | Admit: 2017-07-27 | Discharge: 2017-07-27 | Disposition: A | Discharge status: Home / Self Care | Payer: Managed Care, Other (non HMO) | Source: Ambulatory Visit | Attending: Obstetrics and Gynecology | Admitting: Obstetrics and Gynecology

## 2017-07-27 ENCOUNTER — Inpatient Hospital Stay (HOSPITAL_COMMUNITY): Payer: Managed Care, Other (non HMO)

## 2017-07-27 ENCOUNTER — Encounter (HOSPITAL_COMMUNITY): Payer: Self-pay | Admitting: *Deleted

## 2017-07-27 DIAGNOSIS — F419 Anxiety disorder, unspecified: Secondary | ICD-10-CM | POA: Diagnosis not present

## 2017-07-27 DIAGNOSIS — Z803 Family history of malignant neoplasm of breast: Secondary | ICD-10-CM | POA: Diagnosis not present

## 2017-07-27 DIAGNOSIS — F329 Major depressive disorder, single episode, unspecified: Secondary | ICD-10-CM | POA: Insufficient documentation

## 2017-07-27 DIAGNOSIS — O2243 Hemorrhoids in pregnancy, third trimester: Secondary | ICD-10-CM | POA: Diagnosis not present

## 2017-07-27 DIAGNOSIS — Z791 Long term (current) use of non-steroidal anti-inflammatories (NSAID): Secondary | ICD-10-CM | POA: Diagnosis not present

## 2017-07-27 DIAGNOSIS — Z79899 Other long term (current) drug therapy: Secondary | ICD-10-CM | POA: Diagnosis not present

## 2017-07-27 DIAGNOSIS — Z833 Family history of diabetes mellitus: Secondary | ICD-10-CM | POA: Diagnosis not present

## 2017-07-27 DIAGNOSIS — O4690 Antepartum hemorrhage, unspecified, unspecified trimester: Secondary | ICD-10-CM

## 2017-07-27 DIAGNOSIS — Z818 Family history of other mental and behavioral disorders: Secondary | ICD-10-CM | POA: Insufficient documentation

## 2017-07-27 DIAGNOSIS — Z8249 Family history of ischemic heart disease and other diseases of the circulatory system: Secondary | ICD-10-CM | POA: Diagnosis not present

## 2017-07-27 DIAGNOSIS — O163 Unspecified maternal hypertension, third trimester: Secondary | ICD-10-CM | POA: Diagnosis not present

## 2017-07-27 DIAGNOSIS — Z9889 Other specified postprocedural states: Secondary | ICD-10-CM | POA: Diagnosis not present

## 2017-07-27 DIAGNOSIS — O4693 Antepartum hemorrhage, unspecified, third trimester: Secondary | ICD-10-CM | POA: Diagnosis present

## 2017-07-27 DIAGNOSIS — Z3689 Encounter for other specified antenatal screening: Secondary | ICD-10-CM

## 2017-07-27 DIAGNOSIS — Z3A30 30 weeks gestation of pregnancy: Secondary | ICD-10-CM | POA: Diagnosis not present

## 2017-07-27 DIAGNOSIS — Z6790 Unspecified blood type, Rh positive: Secondary | ICD-10-CM

## 2017-07-27 DIAGNOSIS — Z91013 Allergy to seafood: Secondary | ICD-10-CM | POA: Diagnosis not present

## 2017-07-27 DIAGNOSIS — O99343 Other mental disorders complicating pregnancy, third trimester: Secondary | ICD-10-CM | POA: Diagnosis not present

## 2017-07-27 DIAGNOSIS — Z79891 Long term (current) use of opiate analgesic: Secondary | ICD-10-CM | POA: Diagnosis not present

## 2017-07-27 DIAGNOSIS — K644 Residual hemorrhoidal skin tags: Secondary | ICD-10-CM

## 2017-07-27 HISTORY — DX: Essential (primary) hypertension: I10

## 2017-07-27 NOTE — MAU Note (Signed)
Pt had a BM yesterday and had bright red blood upon wiping.  No problems til this evening when she went to the BR and noticed her panty liner was full of dark red blood and one sm little clot.  Reports a sm amount of lower abd pressure that has been going on as well as good fetal movement.

## 2017-07-27 NOTE — MAU Provider Note (Signed)
History     CSN: [id number]  Arrival date and time: 07/27/17 2139   First Provider Initiated Contact with Patient 07/27/17 2234      Chief Complaint  Patient presents with  . Vaginal Bleeding   36 y.o. G2P1001 .1 wks here with bleeding. She first noticed it on the toilet paper yesterday after a BM. She had more today and soaked a panty liner. She is unsure of it is coming from her rectum or her vagina. She thinks she may have a hemorrhoid. Denies ctx, abd pain or LOF. Good FM. No recent IC. Her pregnancy is complicated by marginal previa.    OB History    Gravida  2   Para  1   Term  1   Preterm      AB      Living  1     SAB      TAB      Ectopic      Multiple      Live Births  1           Past Medical History:  Diagnosis Date  . Anxiety   . Depression   . Hypertension     Past Surgical History:  Procedure Laterality Date  . CYST REMOVAL TRUNK Right 12/1996   Boil/cyst removed right at side at waist line  . DILATION AND EVACUATION N/A 05/21/2016   Procedure: DILATATION AND EVACUATION WITH ULTRASOUND GUIDANCE;  Surgeon: Edwinna Areola, DO;  Location: [facility];  Service: Gynecology;  Laterality: N/A;    Family History  Problem Relation Age of Onset  . Diabetes type II Mother   . Hypertension Mother   . Anxiety disorder Father   . Depression Father   . Breast cancer Maternal Aunt 80    Social History   Tobacco Use  . Smoking status: Never Smoker  . Smokeless tobacco: Never Used  Substance Use Topics  . Alcohol use: Yes    Comment: 1 glass a month, not while preg  . Drug use: No    Allergies:  Allergies  Allergen Reactions  . Shellfish Allergy Swelling    Medications Prior to Admission  Medication Sig Dispense Refill Last Dose  . Prenatal Vit-Fe Fumarate-FA (PRENATAL MULTIVITAMIN) TABS tablet Take 1 tablet by mouth daily at 12 noon.   07/27/2017 at Unknown time  . escitalopram (LEXAPRO) 10 MG tablet Take 10 mg by mouth daily.    More than a month at Unknown time  . hydrochlorothiazide (MICROZIDE) 12.5 MG capsule Take 12.5 mg by mouth daily.   More than a month at Unknown time  . HYDROcodone-acetaminophen (NORCO) 5-325 MG tablet Take 1 tablet by mouth every 4 (four) hours as needed for severe pain. 10 tablet 0 More than a month at Unknown time  . ibuprofen (MOTRIN IB) 200 MG tablet Take 3 tablets (600 mg total) by mouth every 6 (six) hours as needed for mild pain, moderate pain or cramping. 40 tablet 1 More than a month at Unknown time    Review of Systems  Gastrointestinal: Positive for anal bleeding. Negative for abdominal pain and rectal pain.  Genitourinary: Positive for vaginal bleeding.   Physical Exam   Blood pressure 108/73, pulse 96, temperature 98.7 F (37.1 C), temperature source Oral, resp. rate 16, height  (1.6 m), weight 194 lb (88 kg), last menstrual period 12/28/2016.  Physical Exam  Constitutional: She is oriented to person, place, and time. She appears well-developed and well-nourished. No distress.  HENT:  Head: Normocephalic and atraumatic.  Neck: Normal range of motion.  Respiratory: Effort normal. No respiratory distress.  GI: Soft. She exhibits no distension. There is no tenderness.  gravid  Genitourinary:  Genitourinary Comments: Speculum: thick white discharge, no blood, cervix visually closed +blood at rectal area +small external hemorrhoid  Musculoskeletal: Normal range of motion.  Neurological: She is alert and oriented to person, place, and time.  Skin: Skin is warm and dry.  Psychiatric: She has a normal mood and affect.  EFM: 140 bpm, mod variability, + accels, no decels Toco: none [place] (prelim): no previa or abruption, AFI 21 cm, transverse position, CL 3.5 cm  MAU Course  Procedures  MDM Labs and [place] ordered and reviewed. No evidence of VB. Likely bleeding hemorrhoid. Previa resolved. Presentation, clinical findings, and plan discussed with Dr. Ellyn Hack. Stable for  discharge home.  Assessment and Plan   1. [redacted] weeks gestation of pregnancy   2. Vaginal bleeding in pregnancy   3. NST (non-stress test) reactive   4. Blood type, Rh positive   5. External hemorrhoid    Discharge home Follow up in OB office as scheduled this week Return precautions Avoid constipation  Allergies as of 07/27/2017      Reactions   Shellfish Allergy Swelling      Medication List    STOP taking these medications   hydrochlorothiazide 12.5 MG capsule Commonly known as:  MICROZIDE   HYDROcodone-acetaminophen 5-325 MG tablet Commonly known as:  NORCO   ibuprofen 200 MG tablet Commonly known as:  MOTRIN IB     TAKE these medications   escitalopram 10 MG tablet Commonly known as:  LEXAPRO Take 10 mg by mouth daily.   prenatal multivitamin Tabs tablet Take 1 tablet by mouth daily at 12 noon.      Donette Larry, CNM 07/27/2017, 11:20 PM

## 2017-07-27 NOTE — Discharge Instructions (Signed)
Hemorrhoids    Hemorrhoids are swollen veins in and around the rectum or anus. Hemorrhoids can cause pain, itching, or bleeding. Most of the time, they do not cause serious problems. They usually get better with diet changes, lifestyle changes, and other home treatments.  Follow these instructions at home:  Eating and drinking  · Eat foods that have fiber, such as whole grains, beans, nuts, fruits, and vegetables. Ask your doctor about taking products that have added fiber (fiber supplements).  · Drink enough fluid to keep your pee (urine) clear or pale yellow.  For Pain and Swelling  · Take a warm-water bath (sitz bath) for 20 minutes to ease pain. Do this 3-4 times a day.  · If directed, put ice on the painful area. It may be helpful to use ice between your warm baths.  ¨ Put ice in a plastic bag.  ¨ Place a towel between your skin and the bag.  ¨ Leave the ice on for 20 minutes, 2-3 times a day.  General instructions  · Take over-the-counter and prescription medicines only as told by your doctor.  ¨ Medicated creams and medicines that are inserted into the anus (suppositories) may be used or applied as told.  · Exercise often.  · Go to the bathroom when you have the urge to poop (to have a bowel movement). Do not wait.  · Avoid pushing too hard (straining) when you poop.  · Keep the butt area dry and clean. Use wet toilet paper or moist paper towels.  · Do not sit on the toilet for a long time.  Contact a doctor if:  · You have any of these:  ¨ Pain and swelling that do not get better with treatment or medicine.  ¨ Bleeding that will not stop.  ¨ Trouble pooping or you cannot poop.  ¨ Pain or swelling outside the area of the hemorrhoids.  This information is not intended to replace advice given to you by your health care provider. Make sure you discuss any questions you have with your health care provider.  Document Released: 12/11/2007 Document Revised: 08/09/2015 Document Reviewed: 11/15/2014  Elsevier  Interactive Patient Education © 2018 [employer].   

## 2017-09-07 LAB — OB RESULTS CONSOLE GBS: GBS: NEGATIVE

## 2017-09-25 ENCOUNTER — Encounter (HOSPITAL_COMMUNITY): Payer: Self-pay

## 2017-09-25 NOTE — H&P (Signed)
Megan Fields is an 37 y.o. 513P1011 female here for scheduled primary cesarean section for unstable lie. Pt is dated per LMP which was confirmed with 6 week US. Her prenatal care was benign except for unstable lie of fetus. After last visit where options for mgmt of delivery were presented - version verses primary cesarean section, pt opted for the latter. She declines repeat US today prior to procedure.   Pertinent Gynecological History: Menses: n/a Bleeding: none Contraception: none DES exposure: unknown Blood transfusions: none Sexually transmitted diseases: no past history Previous GYN Procedures: n/a  Last mammogram: n/a Date:  Last pap: normal Date:  OB History: G3, P1011   Menstrual History: Menarche age: [age] Patient's last menstrual period was 12/28/2016.    Past Medical History:  Diagnosis Date  . Anxiety   . Depression   . Hypertension     Past Surgical History:  Procedure Laterality Date  . CYST REMOVAL TRUNK Right 12/1996   Boil/cyst removed right at side at waist line  . DILATION AND EVACUATION N/A 05/21/2016   Procedure: DILATATION AND EVACUATION WITH ULTRASOUND GUIDANCE;  Surgeon: Edwinna Areolaecilia Worema Erie Radu, DO;  Location: [facility];  Service: Gynecology;  Laterality: N/A;    Family History  Problem Relation Age of Onset  . Diabetes type II Mother   . Hypertension Mother   . Anxiety disorder Father   . Depression Father   . Breast cancer Maternal Aunt [id number]    Social History:  reports that she has never smoked. She has never used smokeless tobacco. She reports that she drinks alcohol. She reports that she does not use drugs.  Allergies:  Allergies  Allergen Reactions  . Shellfish Allergy Swelling    No medications prior to admission.    Review of Systems  Constitutional: Positive for malaise/fatigue. Negative for chills, fever and weight loss.  Eyes: Negative for blurred vision and double vision.  Respiratory: Positive for shortness of breath.    Cardiovascular: Positive for leg swelling.  Gastrointestinal: Positive for abdominal pain. Negative for heartburn, nausea and vomiting.  Genitourinary: Negative for dysuria.  Musculoskeletal: Positive for myalgias.  Skin: Negative for itching and rash.  Neurological: Negative for dizziness and headaches.  Endo/Heme/Allergies: Does not bruise/bleed easily.  Psychiatric/Behavioral: Negative for depression, hallucinations, substance abuse and suicidal ideas. The patient is nervous/anxious.     Last menstrual period 12/28/2016. Physical Exam  Constitutional: She is oriented to person, place, and time. She appears well-developed and well-nourished.  Neck: Normal range of motion.  Cardiovascular: Normal rate.  Respiratory: Effort normal.  GI: Soft.  Genitourinary: Vagina normal and uterus normal.  Musculoskeletal: Normal range of motion. She exhibits edema.  Neurological: She is alert and oriented to person, place, and time.  Skin: Skin is warm.  Psychiatric: She has a normal mood and affect. Her behavior is normal. Judgment and thought content normal.    No results found for this or any previous visit (from the past 24 hour(s)).  No results found.  Assessment/Plan: 23P1011 female here for scheduled primary cesarean section for unstable lie ( breech at last check) Declined scan prior to procedure to confirm lie- last check a week ago fetal head was under maternal sternum Risks/benefits of c/s discussed; declined version To OR when ready  [name] 09/25/2017, 12:20 PM

## 2017-09-28 ENCOUNTER — Encounter (HOSPITAL_COMMUNITY)
Admission: RE | Admit: 2017-09-28 | Discharge: 2017-09-28 | Disposition: A | Discharge status: Home / Self Care | Payer: Managed Care, Other (non HMO) | Source: Ambulatory Visit | Attending: Obstetrics and Gynecology | Admitting: Obstetrics and Gynecology

## 2017-09-28 HISTORY — DX: Unspecified hearing loss, right ear: H91.91

## 2017-09-28 LAB — TYPE AND SCREEN
ABO/RH(D): B POS
Antibody Screen: NEGATIVE

## 2017-09-28 LAB — CBC
HEMATOCRIT: 34.8 % — AB (ref 36.0–46.0)
HEMOGLOBIN: 12 g/dL (ref 12.0–15.0)
MCH: 30.9 pg (ref 26.0–34.0)
MCHC: 34.5 g/dL (ref 30.0–36.0)
MCV: 89.7 fL (ref 78.0–100.0)
Platelets: 219 10*3/uL (ref 150–400)
RBC: 3.88 MIL/uL (ref 3.87–5.11)
RDW: 13.4 % (ref 11.5–15.5)
WBC: 8.1 10*3/uL (ref 4.0–10.5)

## 2017-09-28 NOTE — Patient Instructions (Signed)
Megan Fields  09/28/2017   Your procedure is scheduled on:  09/29/2017  [facility] at 1030 AM.  Pick up the phone at the desk and dial [phone]  Call this number if you have problems the morning of surgery:[phone]  Remember:   Do not eat food:(After Midnight) Desps de medianoche.  Do not drink clear liquids: (After Midnight) Desps de medianoche.  Take these medicines the morning of surgery with A SIP OF WATER: none   Do not wear jewelry, make-up or nail polish.  Do not wear lotions, powders, or perfumes. Do not wear deodorant.  Do not shave 48 hours prior to surgery.  Do not bring valuables to the hospital.  [facility] is not   responsible for any belongings or valuables brought to the hospital.  Contacts, dentures or bridgework may not be worn into surgery.  Leave suitcase in the car. After surgery it may be brought to your room.  For patients admitted to the hospital, checkout time is 11:00 AM the day of              discharge.    N/A   Please read over the following fact sheets that you were given:   Surgical Site Infection Prevention

## 2017-09-29 ENCOUNTER — Encounter (HOSPITAL_COMMUNITY): Payer: Self-pay | Admitting: *Deleted

## 2017-09-29 ENCOUNTER — Inpatient Hospital Stay (HOSPITAL_COMMUNITY)
Admission: RE | Admit: 2017-09-29 | Discharge: 2017-10-02 | DRG: 788 | Disposition: A | Discharge status: Home / Self Care | Payer: Managed Care, Other (non HMO) | Attending: Obstetrics and Gynecology | Admitting: Obstetrics and Gynecology

## 2017-09-29 ENCOUNTER — Inpatient Hospital Stay (HOSPITAL_COMMUNITY): Payer: Managed Care, Other (non HMO) | Admitting: Anesthesiology

## 2017-09-29 ENCOUNTER — Other Ambulatory Visit: Payer: Self-pay

## 2017-09-29 ENCOUNTER — Encounter (HOSPITAL_COMMUNITY)
Admission: RE | Disposition: A | Discharge status: Home / Self Care | Payer: Self-pay | Source: Home / Self Care | Attending: Obstetrics and Gynecology

## 2017-09-29 DIAGNOSIS — O321XX0 Maternal care for breech presentation, not applicable or unspecified: Principal | ICD-10-CM | POA: Diagnosis present

## 2017-09-29 DIAGNOSIS — F419 Anxiety disorder, unspecified: Secondary | ICD-10-CM | POA: Diagnosis present

## 2017-09-29 DIAGNOSIS — Z3A39 39 weeks gestation of pregnancy: Secondary | ICD-10-CM

## 2017-09-29 DIAGNOSIS — O99345 Other mental disorders complicating the puerperium: Secondary | ICD-10-CM | POA: Diagnosis present

## 2017-09-29 DIAGNOSIS — Z98891 History of uterine scar from previous surgery: Secondary | ICD-10-CM

## 2017-09-29 LAB — RPR: RPR: NONREACTIVE

## 2017-09-29 SURGERY — Surgical Case
Anesthesia: Spinal

## 2017-09-29 MED ORDER — MEPERIDINE HCL 25 MG/ML IJ SOLN: 6.2500 mg | INTRAMUSCULAR | Status: DC | PRN

## 2017-09-29 MED ORDER — PHENYLEPHRINE 40 MCG/ML (10ML) SYRINGE FOR IV PUSH (FOR BLOOD PRESSURE SUPPORT)
PREFILLED_SYRINGE | INTRAVENOUS | Status: DC | PRN
  Administered 2017-09-29 (×4): 80 ug via INTRAVENOUS
  Administered 2017-09-29: 40 ug via INTRAVENOUS

## 2017-09-29 MED ORDER — NALBUPHINE HCL 10 MG/ML IJ SOLN: 5.0000 mg | Freq: Once | INTRAMUSCULAR | Status: DC | PRN

## 2017-09-29 MED ORDER — DEXAMETHASONE SODIUM PHOSPHATE 4 MG/ML IJ SOLN
INTRAMUSCULAR | Status: AC
  Filled 2017-09-29: qty 1

## 2017-09-29 MED ORDER — DIPHENHYDRAMINE HCL 50 MG/ML IJ SOLN: 12.5000 mg | INTRAMUSCULAR | Status: DC | PRN

## 2017-09-29 MED ORDER — SENNOSIDES-DOCUSATE SODIUM 8.6-50 MG PO TABS
2.0000 | ORAL_TABLET | ORAL | Status: DC
  Administered 2017-09-30 – 2017-10-01 (×3): 2 via ORAL
  Filled 2017-09-29 (×3): qty 2

## 2017-09-29 MED ORDER — OXYCODONE HCL 5 MG PO TABS: 10.0000 mg | ORAL_TABLET | ORAL | Status: DC | PRN

## 2017-09-29 MED ORDER — IBUPROFEN 600 MG PO TABS
600.0000 mg | ORAL_TABLET | Freq: Four times a day (QID) | ORAL | Status: DC
  Administered 2017-09-29 – 2017-10-02 (×12): 600 mg via ORAL
  Filled 2017-09-29 (×12): qty 1

## 2017-09-29 MED ORDER — KETOROLAC TROMETHAMINE 30 MG/ML IJ SOLN: 30.0000 mg | Freq: Four times a day (QID) | INTRAMUSCULAR | Status: AC | PRN

## 2017-09-29 MED ORDER — NALOXONE HCL 0.4 MG/ML IJ SOLN: 0.4000 mg | INTRAMUSCULAR | Status: DC | PRN

## 2017-09-29 MED ORDER — LACTATED RINGERS IV SOLN
INTRAVENOUS | Status: DC
  Administered 2017-09-29: 13:00:00 via INTRAVENOUS

## 2017-09-29 MED ORDER — SIMETHICONE 80 MG PO CHEW
80.0000 mg | CHEWABLE_TABLET | ORAL | Status: DC
  Administered 2017-09-30 – 2017-10-01 (×3): 80 mg via ORAL
  Filled 2017-09-29 (×3): qty 1

## 2017-09-29 MED ORDER — MORPHINE SULFATE (PF) 0.5 MG/ML IJ SOLN: INTRAMUSCULAR | Status: DC | PRN

## 2017-09-29 MED ORDER — SODIUM CHLORIDE 0.9% FLUSH: 3.0000 mL | INTRAVENOUS | Status: DC | PRN

## 2017-09-29 MED ORDER — NALBUPHINE HCL 10 MG/ML IJ SOLN: 5.0000 mg | INTRAMUSCULAR | Status: DC | PRN

## 2017-09-29 MED ORDER — CEFAZOLIN SODIUM-DEXTROSE 2-4 GM/100ML-% IV SOLN
2.0000 g | INTRAVENOUS | Status: AC
  Administered 2017-09-29: 2 g via INTRAVENOUS
  Filled 2017-09-29: qty 100

## 2017-09-29 MED ORDER — DEXAMETHASONE SODIUM PHOSPHATE 4 MG/ML IJ SOLN
INTRAMUSCULAR | Status: DC | PRN
  Administered 2017-09-29: 4 mg via INTRAVENOUS

## 2017-09-29 MED ORDER — OXYCODONE HCL 5 MG/5ML PO SOLN: 5.0000 mg | Freq: Once | ORAL | Status: DC | PRN

## 2017-09-29 MED ORDER — LACTATED RINGERS IV SOLN: INTRAVENOUS | Status: DC

## 2017-09-29 MED ORDER — ONDANSETRON HCL 4 MG/2ML IJ SOLN
INTRAMUSCULAR | Status: AC
  Filled 2017-09-29: qty 2

## 2017-09-29 MED ORDER — FENTANYL CITRATE (PF) 100 MCG/2ML IJ SOLN: 25.0000 ug | INTRAMUSCULAR | Status: DC | PRN

## 2017-09-29 MED ORDER — ONDANSETRON HCL 4 MG/2ML IJ SOLN
INTRAMUSCULAR | Status: DC | PRN
  Administered 2017-09-29: 4 mg via INTRAVENOUS

## 2017-09-29 MED ORDER — FENTANYL CITRATE (PF) 100 MCG/2ML IJ SOLN
INTRAMUSCULAR | Status: AC
  Filled 2017-09-29: qty 2

## 2017-09-29 MED ORDER — SCOPOLAMINE 1 MG/3DAYS TD PT72
1.0000 | MEDICATED_PATCH | Freq: Once | TRANSDERMAL | Status: AC
  Administered 2017-09-29: 1.5 mg via TRANSDERMAL

## 2017-09-29 MED ORDER — DIBUCAINE 1 % RE OINT: 1.0000 "application " | TOPICAL_OINTMENT | RECTAL | Status: DC | PRN

## 2017-09-29 MED ORDER — COCONUT OIL OIL
1.0000 "application " | TOPICAL_OIL | Status: DC | PRN
  Administered 2017-09-30: 1 via TOPICAL
  Filled 2017-09-29: qty 120

## 2017-09-29 MED ORDER — WITCH HAZEL-GLYCERIN EX PADS: 1.0000 "application " | MEDICATED_PAD | CUTANEOUS | Status: DC | PRN

## 2017-09-29 MED ORDER — ONDANSETRON HCL 4 MG/2ML IJ SOLN: 4.0000 mg | Freq: Three times a day (TID) | INTRAMUSCULAR | Status: DC | PRN

## 2017-09-29 MED ORDER — LACTATED RINGERS IV SOLN
INTRAVENOUS | Status: DC | PRN
  Administered 2017-09-29 (×3): via INTRAVENOUS

## 2017-09-29 MED ORDER — ONDANSETRON HCL 4 MG/2ML IJ SOLN: 4.0000 mg | Freq: Four times a day (QID) | INTRAMUSCULAR | Status: DC | PRN

## 2017-09-29 MED ORDER — ZOLPIDEM TARTRATE 5 MG PO TABS: 5.0000 mg | ORAL_TABLET | Freq: Every evening | ORAL | Status: DC | PRN

## 2017-09-29 MED ORDER — PRENATAL MULTIVITAMIN CH
1.0000 | ORAL_TABLET | Freq: Every day | ORAL | Status: DC
  Administered 2017-09-30 – 2017-10-02 (×3): 1 via ORAL
  Filled 2017-09-29 (×3): qty 1

## 2017-09-29 MED ORDER — FENTANYL CITRATE (PF) 100 MCG/2ML IJ SOLN: INTRAMUSCULAR | Status: DC | PRN

## 2017-09-29 MED ORDER — FENTANYL CITRATE (PF) 100 MCG/2ML IJ SOLN
INTRAMUSCULAR | Status: DC | PRN
  Administered 2017-09-29: 10 ug via INTRATHECAL

## 2017-09-29 MED ORDER — OXYTOCIN 10 UNIT/ML IJ SOLN
INTRAMUSCULAR | Status: AC
  Filled 2017-09-29: qty 4

## 2017-09-29 MED ORDER — SIMETHICONE 80 MG PO CHEW
80.0000 mg | CHEWABLE_TABLET | ORAL | Status: DC | PRN
  Filled 2017-09-29: qty 1

## 2017-09-29 MED ORDER — MORPHINE SULFATE (PF) 0.5 MG/ML IJ SOLN
INTRAMUSCULAR | Status: DC | PRN
  Administered 2017-09-29: .2 mg via INTRATHECAL

## 2017-09-29 MED ORDER — SIMETHICONE 80 MG PO CHEW
80.0000 mg | CHEWABLE_TABLET | Freq: Three times a day (TID) | ORAL | Status: DC
  Administered 2017-09-29 – 2017-10-02 (×8): 80 mg via ORAL
  Filled 2017-09-29 (×9): qty 1

## 2017-09-29 MED ORDER — NALBUPHINE HCL 10 MG/ML IJ SOLN
INTRAMUSCULAR | Status: AC
  Filled 2017-09-29: qty 1

## 2017-09-29 MED ORDER — BUPIVACAINE IN DEXTROSE 0.75-8.25 % IT SOLN
INTRATHECAL | Status: DC | PRN
  Administered 2017-09-29: 1.8 mL via INTRATHECAL

## 2017-09-29 MED ORDER — MORPHINE SULFATE (PF) 0.5 MG/ML IJ SOLN
INTRAMUSCULAR | Status: AC
Start: 2017-09-29 — End: ?
  Filled 2017-09-29: qty 10

## 2017-09-29 MED ORDER — OXYCODONE HCL 5 MG PO TABS: 5.0000 mg | ORAL_TABLET | ORAL | Status: DC | PRN

## 2017-09-29 MED ORDER — PHENYLEPHRINE 8 MG IN D5W 100 ML (0.08MG/ML) PREMIX OPTIME
INJECTION | INTRAVENOUS | Status: AC
  Filled 2017-09-29: qty 100

## 2017-09-29 MED ORDER — MENTHOL 3 MG MT LOZG: 1.0000 | LOZENGE | OROMUCOSAL | Status: DC | PRN

## 2017-09-29 MED ORDER — OXYTOCIN 10 UNIT/ML IJ SOLN
INTRAVENOUS | Status: DC | PRN
  Administered 2017-09-29: 40 [IU] via INTRAVENOUS

## 2017-09-29 MED ORDER — DIPHENHYDRAMINE HCL 25 MG PO CAPS: 25.0000 mg | ORAL_CAPSULE | Freq: Four times a day (QID) | ORAL | Status: DC | PRN

## 2017-09-29 MED ORDER — PHENYLEPHRINE 8 MG IN D5W 100 ML (0.08MG/ML) PREMIX OPTIME
INJECTION | INTRAVENOUS | Status: DC | PRN
  Administered 2017-09-29: 60 ug/min via INTRAVENOUS

## 2017-09-29 MED ORDER — NALBUPHINE HCL 10 MG/ML IJ SOLN
5.0000 mg | INTRAMUSCULAR | Status: DC | PRN
  Administered 2017-09-29: 5 mg via INTRAVENOUS

## 2017-09-29 MED ORDER — ACETAMINOPHEN 325 MG PO TABS: 650.0000 mg | ORAL_TABLET | ORAL | Status: DC | PRN

## 2017-09-29 MED ORDER — OXYCODONE HCL 5 MG PO TABS: 5.0000 mg | ORAL_TABLET | Freq: Once | ORAL | Status: DC | PRN

## 2017-09-29 MED ORDER — DIPHENHYDRAMINE HCL 25 MG PO CAPS
25.0000 mg | ORAL_CAPSULE | ORAL | Status: DC | PRN
  Filled 2017-09-29: qty 1

## 2017-09-29 MED ORDER — SODIUM CHLORIDE 0.9 % IR SOLN
Status: DC | PRN
  Administered 2017-09-29: 1

## 2017-09-29 MED ORDER — SCOPOLAMINE 1 MG/3DAYS TD PT72
MEDICATED_PATCH | TRANSDERMAL | Status: AC
  Filled 2017-09-29: qty 1

## 2017-09-29 MED ORDER — NALOXONE HCL 4 MG/10ML IJ SOLN
1.0000 ug/kg/h | INTRAVENOUS | Status: DC | PRN
  Filled 2017-09-29: qty 5

## 2017-09-29 MED ORDER — TETANUS-DIPHTH-ACELL PERTUSSIS 5-2.5-18.5 LF-MCG/0.5 IM SUSP
0.5000 mL | Freq: Once | INTRAMUSCULAR | Status: AC
  Administered 2017-10-01: 0.5 mL via INTRAMUSCULAR
  Filled 2017-09-29: qty 0.5

## 2017-09-29 MED ORDER — OXYTOCIN 40 UNITS IN LACTATED RINGERS INFUSION - SIMPLE MED: 2.5000 [IU]/h | INTRAVENOUS | Status: AC

## 2017-09-29 SURGICAL SUPPLY — 37 items
BENZOIN TINCTURE PRP APPL 2/3 (GAUZE/BANDAGES/DRESSINGS) ×3 IMPLANT
CHLORAPREP W/TINT 26ML (MISCELLANEOUS) ×3 IMPLANT
CLAMP CORD UMBIL (MISCELLANEOUS) IMPLANT
CLOSURE STERI STRIP 1/2 X4 (GAUZE/BANDAGES/DRESSINGS) ×3 IMPLANT
CLOSURE WOUND 1/2 X4 (GAUZE/BANDAGES/DRESSINGS)
CLOTH BEACON ORANGE TIMEOUT ST (SAFETY) ×3 IMPLANT
DRAPE C SECTION CLR SCREEN (DRAPES) ×3 IMPLANT
DRSG OPSITE POSTOP 4X10 (GAUZE/BANDAGES/DRESSINGS) ×3 IMPLANT
ELECT REM PT RETURN 9FT ADLT (ELECTROSURGICAL) ×3
ELECTRODE REM PT RTRN 9FT ADLT (ELECTROSURGICAL) ×1 IMPLANT
EXTRACTOR VACUUM KIWI (MISCELLANEOUS) IMPLANT
GLOVE BIO SURGEON STRL SZ 6.5 (GLOVE) ×2 IMPLANT
GLOVE BIO SURGEONS STRL SZ 6.5 (GLOVE) ×1
GLOVE BIOGEL PI IND STRL 7.0 (GLOVE) ×2 IMPLANT
GLOVE BIOGEL PI INDICATOR 7.0 (GLOVE) ×4
GOWN STRL REUS W/TWL LRG LVL3 (GOWN DISPOSABLE) ×6 IMPLANT
KIT ABG SYR 3ML LUER SLIP (SYRINGE) IMPLANT
NEEDLE HYPO 25X5/8 SAFETYGLIDE (NEEDLE) IMPLANT
NS IRRIG 1000ML POUR BTL (IV SOLUTION) ×3 IMPLANT
PACK C SECTION WH (CUSTOM PROCEDURE TRAY) ×3 IMPLANT
PAD OB MATERNITY 4.3X12.25 (PERSONAL CARE ITEMS) ×3 IMPLANT
RETRACTOR WND ALEXIS 25 LRG (MISCELLANEOUS) ×1 IMPLANT
RTRCTR C-SECT PINK 25CM LRG (MISCELLANEOUS) IMPLANT
RTRCTR WOUND ALEXIS 25CM LRG (MISCELLANEOUS) ×3
STRIP CLOSURE SKIN 1/2X4 (GAUZE/BANDAGES/DRESSINGS) IMPLANT
SUT CHROMIC 1 CTX 36 (SUTURE) ×6 IMPLANT
SUT PLAIN 0 NONE (SUTURE) IMPLANT
SUT PLAIN 2 0 XLH (SUTURE) ×3 IMPLANT
SUT VIC AB 0 CT1 27 (SUTURE) ×4
SUT VIC AB 0 CT1 27XBRD ANBCTR (SUTURE) ×2 IMPLANT
SUT VIC AB 2-0 CT1 27 (SUTURE) ×2
SUT VIC AB 2-0 CT1 TAPERPNT 27 (SUTURE) ×1 IMPLANT
SUT VIC AB 3-0 CT1 27 (SUTURE)
SUT VIC AB 3-0 CT1 TAPERPNT 27 (SUTURE) IMPLANT
SUT VIC AB 4-0 KS 27 (SUTURE) ×3 IMPLANT
TOWEL OR 17X24 6PK STRL BLUE (TOWEL DISPOSABLE) ×3 IMPLANT
TRAY FOLEY W/BAG SLVR 14FR LF (SET/KITS/TRAYS/PACK) ×3 IMPLANT

## 2017-09-29 NOTE — Lactation Note (Signed)
This note was copied from a baby's chart. Lactation Consultation Note  Patient Name: [name] MVHQI'OToday's Date: 09/29/2017 Reason for consult: Initial assessment;Term Breastfeeding consultation services and support information given to patient.  This is mom's second baby.  She states breastfeeding did not work out with first Development worker, international aidbaby.  Newborn is currently latched and feeding well.  Active suck/swallows noted. Instructed to feed baby with cues.  Discussed colostrum and milk coming to volume.  Encouraged to call for assist/concerns prn.  Maternal Data Has patient been taught Hand Expression?: Yes Does the patient have breastfeeding experience prior to this delivery?: Yes  Feeding Feeding Type: Breast Fed Length of feed: 15 min  LATCH Score                   Interventions Interventions: Breast feeding basics reviewed  Lactation Tools Discussed/Used     Consult Status Consult Status: Follow-up Date: 09/30/17 Follow-up type: In-patient    Huston FoleyMOULDEN, [employer] S 09/29/2017, 6:20 PM

## 2017-09-29 NOTE — Anesthesia Preprocedure Evaluation (Signed)
Anesthesia Evaluation  Patient identified by MRN, date of birth, ID band Patient awake    Reviewed: Allergy & Precautions, H&P , NPO status , Patient's Chart, lab work & pertinent test results  Airway Mallampati: II   Neck ROM: full    Dental   Pulmonary neg pulmonary ROS,    breath sounds clear to auscultation       Cardiovascular hypertension,  Rhythm:regular Rate:Normal     Neuro/Psych PSYCHIATRIC DISORDERS Anxiety Depression    GI/Hepatic   Endo/Other    Renal/GU      Musculoskeletal   Abdominal   Peds  Hematology   Anesthesia Other Findings   Reproductive/Obstetrics (+) Pregnancy                             Anesthesia Physical Anesthesia Plan  ASA: II  Anesthesia Plan: Spinal   Post-op Pain Management:    Induction: Intravenous  PONV Risk Score and Plan: 2 and Treatment may vary due to age or medical condition and Ondansetron  Airway Management Planned: Simple Face Mask  Additional Equipment:   Intra-op Plan:   Post-operative Plan:   Informed Consent: I have reviewed the patients History and Physical, chart, labs and discussed the procedure including the risks, benefits and alternatives for the proposed anesthesia with the patient or authorized representative who has indicated his/her understanding and acceptance.     Plan Discussed with: CRNA, Anesthesiologist and Surgeon  Anesthesia Plan Comments:         Anesthesia Quick Evaluation

## 2017-09-29 NOTE — Anesthesia Procedure Notes (Signed)
Spinal  Patient location during procedure: OR Start time: 09/29/2017 12:34 PM End time: 09/29/2017 12:36 PM Staffing Anesthesiologist: Achille RichHodierne, Dabid Godown, MD Performed: anesthesiologist  Preanesthetic Checklist Completed: patient identified, surgical consent, pre-op evaluation, timeout performed, IV checked, risks and benefits discussed and monitors and equipment checked Spinal Block Patient position: sitting Prep: DuraPrep Patient monitoring: cardiac monitor, continuous pulse ox and blood pressure Approach: midline Location: L3-4 Injection technique: single-shot Needle Needle type: Pencan  Needle gauge: 24 G Needle length: 9 cm Assessment Sensory level: T10 Additional Notes Functioning IV was confirmed and monitors were applied. Sterile prep and drape, including hand hygiene and sterile gloves were used. The patient was positioned and the spine was prepped. The skin was anesthetized with lidocaine.  Free flow of clear CSF was obtained prior to injecting local anesthetic into the CSF.  The spinal needle aspirated freely following injection.  The needle was carefully withdrawn.  The patient tolerated the procedure well.

## 2017-09-29 NOTE — Transfer of Care (Signed)
Immediate Anesthesia Transfer of Care Note  Patient: Megan Fields  Procedure(s) Performed: CESAREAN SECTION (N/A )  Patient Location: PACU  Anesthesia Type:Spinal  Level of Consciousness: awake, alert  and oriented  Airway & Oxygen Therapy: Patient Spontanous Breathing  Post-op Assessment: Report given to RN and Post -op Vital signs reviewed and stable  Post vital signs: Reviewed and stable HR 85, RR 12, SaO2 100%, BP 91/56  Last Vitals:  Vitals Value Taken Time  BP    Temp    Pulse    Resp    SpO2      Last Pain:  Vitals:   09/29/17 1037  TempSrc: Oral  PainSc:       Patients Stated Pain Goal: 8 (09/29/17 1037)  Complications: No apparent anesthesia complications

## 2017-09-29 NOTE — Op Note (Signed)
Operative Note    Preoperative Diagnosis: 1. IUP at term 2. Unstable lie   Postoperative Diagnosis: 1. IUP at term 2. vertex   Procedure: Primary low transverse cesarean section with two layered closure   Surgeon: Britt BottomBanga C DO  Anesthesia: Spinal  Fluids: 2400ml EBL: 726ml UOP: 150ml   Findings: Viable female infant in cephalic position, grossly normal uterus, tubes and ovaries   Specimen: placenta donated   Procedure Note Pt declined US prior to OR. Consent for procedure confirmed pre-op. Patient was taken to the operating room where spinal anesthesia was administered and found to be adequate.She was prepped and draped in the normal sterile fashion in the dorsal supine position with a leftward tilt.  An appropriate time out was performed. lAllis clamp test re-confirmed adequate anesthesia. Next a pfannenstiel skin incision was then made with the scalpel and carried through to the underlying layer of fascia by sharp dissection and Bovie cautery. The fascia was nicked in the midline and the incision was extended laterally with Mayo scissors. The inferior aspect of the incision was grasped Coker clamps and dissected off the underlying rectus muscles. In a similar fashion the superior aspect was dissected off the rectus muscles. Rectus muscles were separated in the midline and the peritoneal cavity entered bluntly. The peritoneal incision was then extended both superiorly and inferiorly with careful attention to avoid both bowel and bladder. The Alexis self-retaining wound retractor was then placed within the incision and the lower uterine segment exposed. The bladder flap was developed with Metzenbaum scissors and pushed away from the lower uterine segment. The lower uterine segment was then incised in a transverse fashion and the cavity itself entered bluntly. The incision was extended bluntly. The infant's head was then lifted and delivered from the incision without difficulty. Nuchal x  1 easily reduced over infants head.  The remainder of the infant delivered and spontaneous cry noted. The cord was clamped and cut after a minute delay. The infant was handed off to the waiting NICU staff. The placenta was then spontaneously expressed from the uterus and the uterus cleared of all clots and debris with moist lap sponge. The uterine incision was then repaired in 2 layers the first layer was a running locked layer 1-0 chromic and the second an imbricating layer of the same suture. The tubes and ovaries were inspected and the gutters cleared of all clots and debris. The uterine incision was inspected and found to be hemostatic. All instruments and sponges as well as the Alexis retractor were then removed from the abdomen. The rectus muscles and peritoneum were then reapproximated with several interrupted mattress sutures of 2-0 Vicryl. The fascia was then closed with 0 Vicryl in a running fashion. Subcutaneous tissue was reapproximated with 3-0 plain in a running fashion. The skin was closed with a subcuticular stitch of 4-0 Vicryl on a Keith needle and then reinforced with benzoin and Steri-Strips. At the conclusion of the procedure all instruments and sponge counts were correct. Patient was taken to the recovery room in good condition with her baby accompanying her skin to skin.

## 2017-09-29 NOTE — Lactation Note (Signed)
This note was copied from a baby's chart. Lactation Consultation Note  Patient Name: Megan Roseanne KaufmanQuiana Drone OZHYQ'MToday's Date: 09/29/2017    P2 [facility]  Mother has just finished breastfeeding on the left breast for approximately 20 minutes.  She stated that she is having a hard time keeping baby latched to the left breast but the right breast is easy to latch.  Baby is sleeping and not showing any cues.  I assume that she has taken enough colostrum to satisfy.  Upon assessment, I noted mother has soft breasts with flat nipples.  Provided breast shells and manual pump with instructions for use.  RN stated that she heard baby swallowing multiple times when she was on that breast.  Offered to assist at the next feeding if she would like.  Mother will call as needed for assistance.      Maternal Data    Feeding Feeding Type: Breast Fed Length of feed: 20 min  LATCH Score Latch: Repeated attempts needed to sustain latch, nipple held in mouth throughout feeding, stimulation needed to elicit sucking reflex.  Audible Swallowing: Spontaneous and intermittent  Type of Nipple: Everted at rest and after stimulation  Comfort (Breast/Nipple): Soft / non-tender  Hold (Positioning): Assistance needed to correctly position infant at breast and maintain latch.  LATCH Score: 8  Interventions    Lactation Tools Discussed/Used     Consult Status      [place] Fant R Tomas Schamp 09/29/2017, 10:45 PM

## 2017-09-30 LAB — CBC
HCT: 30.6 % — ABNORMAL LOW (ref 36.0–46.0)
Hemoglobin: 10.9 g/dL — ABNORMAL LOW (ref 12.0–15.0)
MCH: 31.6 pg (ref 26.0–34.0)
MCHC: 35.6 g/dL (ref 30.0–36.0)
MCV: 88.7 fL (ref 78.0–100.0)
Platelets: 206 K/uL (ref 150–400)
RBC: 3.45 MIL/uL — ABNORMAL LOW (ref 3.87–5.11)
RDW: 13.1 % (ref 11.5–15.5)
WBC: 12.6 K/uL — ABNORMAL HIGH (ref 4.0–10.5)

## 2017-09-30 LAB — BIRTH TISSUE RECOVERY COLLECTION (PLACENTA DONATION)

## 2017-09-30 NOTE — Progress Notes (Signed)
Patient ID: [name], female   DOB: May 31, 1980, 37 y.o.   MRN: [id number] POD#1 Pt doing well. Pain well controlled. Lochia mild. Denies any CP, SOB or HA. Bonding well with baby - breastfeeding. Has no voided yet since foley removed this am VSS ABD - dressing in place; c/d/i EXT - no homans  12.6>10.9<206  A/P: POD#1 s/p pltc/s for unstable lie - doing well         Monitor voids         Routine pp/post op care

## 2017-10-01 MED ORDER — BISACODYL 10 MG RE SUPP
10.0000 mg | Freq: Once | RECTAL | Status: DC
  Filled 2017-10-01 (×2): qty 1

## 2017-10-01 NOTE — Lactation Note (Signed)
This note was copied from a baby's chart. Lactation Consultation Note  Patient Name: Megan Roseanne KaufmanQuiana Mau WUJWJ'XToday's Date: 10/01/2017 Reason for consult: Follow-up assessment;Difficult latch;Infant weight loss;Term  P2,  Infant weight loss 3%, c/s delivery  Per mom, sore breast and infant latch on and off breast. LC entered room baby was suckling on a pacifier, discuss w/ mom pacifier non-nutritive  suckling and may interfere w/ breastfeeding. Mom used football hold on right breast infant was latch w/ nipple shield but still felt latch was painful, work with putting chin down help with Mom discomfort LC did not hear swallowing or milk transfer..  Mom did not want latch infant w/out nipple shield. Mom has coconut oil for breast soreness, any EBM  Mom will apply to sore breast  and use comfort gels when wearing bra. Mom was given manual pump pre-pump breast per mom,only used manual BP maybe twice. Mom acceptable to using DEBP , Mom was pumping with DEBP when LC left room . Mom will pump for 15 minutes immediately after feedings to help with breast milk stimulation and induction. Mom encouraged to feed baby 8-12 times/24 hours and with feeding cues.  Maternal Data    Feeding Feeding Type: Breast Fed Length of feed: 10 min  LATCH Score Latch: Repeated attempts needed to sustain latch, nipple held in mouth throughout feeding, stimulation needed to elicit sucking reflex.  Audible Swallowing: A few with stimulation  Type of Nipple: Flat  Comfort (Breast/Nipple): Filling, red/small blisters or bruises, mild/mod discomfort  Hold (Positioning): Assistance needed to correctly position infant at breast and maintain latch.  LATCH Score: 5  Interventions Interventions: Assisted with latch;Skin to skin;Adjust position;Support pillows;Position options;DEBP;Coconut oil;Shells;Comfort gels  Lactation Tools Discussed/Used Tools: Shells;Flanges;Coconut oil;Comfort gels;Nipple Ventura SellersShields Flange Size:  21 Shell Type: Sore WIC Program: No Pump Review: Setup, frequency, and cleaning Initiated by:: Danelle Earthlyobin Akira Adelsberger, IBCLC Date initiated:: 10/01/17   Consult Status Consult Status: Follow-up Date: 10/02/17 Follow-up type: In-patient    Danelle EarthlyRobin Candus Braud 10/01/2017, 5:01 AM

## 2017-10-01 NOTE — Lactation Note (Addendum)
This note was copied from a baby's chart. Lactation Consultation Note Mom with sore nipples, bleeding on the left one. Baby has just had formula and mom does not want to latch baby for a while to give her nipples a break. Mom planning to pump after she goes to the bathroom. Reports severe pain with and without NS. Encouraged to call for assist later today when she is ready to try to latch baby. No questions at present.   Patient Name: Megan Roseanne KaufmanQuiana Fields ZOXWR'UToday's Date: 10/01/2017 Reason for consult: Follow-up assessment   Maternal Data    Feeding    LATCH Score                   Interventions    Lactation Tools Discussed/Used     Consult Status Consult Status: Follow-up Date: 10/01/17 Follow-up type: In-patient    Pamelia HoitWeeks, Dee Paden D 10/01/2017, 10:08 AM

## 2017-10-01 NOTE — Anesthesia Postprocedure Evaluation (Signed)
Anesthesia Post Note  Patient: Lynelle SmokeQuiana N Perusse  Procedure(s) Performed: CESAREAN SECTION (N/A )     Patient location during evaluation: PACU Anesthesia Type: Spinal Level of consciousness: oriented and awake and alert Pain management: pain level controlled Vital Signs Assessment: post-procedure vital signs reviewed and stable Respiratory status: spontaneous breathing, respiratory function stable and patient connected to nasal cannula oxygen Cardiovascular status: blood pressure returned to baseline and stable Postop Assessment: no headache, no backache and no apparent nausea or vomiting Anesthetic complications: no    Last Vitals:  Vitals:   09/30/17 2146 10/01/17 0606  BP: 96/70 94/61  Pulse: 73 86  Resp: 18 18  Temp: 36.5 C 36.9 C  SpO2: 97%     Last Pain:  Vitals:   10/01/17 0606  TempSrc: Oral  PainSc:                  Samanyu Tinnell S

## 2017-10-01 NOTE — Progress Notes (Signed)
Subjective: Postpartum Day 2: Cesarean Delivery Patient reports incisional pain, tolerating PO and no problems voiding.    Objective: Vital signs in last 24 hours: Temp:  [97.7 F (36.5 C)-98.5 F (36.9 C)] 98.4 F (36.9 C) (07/18 0606) Pulse Rate:  [73-87] 86 (07/18 0606) Resp:  [18] 18 (07/18 0606) BP: (89-96)/(61-70) 94/61 (07/18 0606) SpO2:  [97 %-100 %] 97 % (07/17 2146)  Physical Exam:  General: alert and no distress Lochia: appropriate Uterine Fundus: firm Incision: healing well DVT Evaluation: No evidence of DVT seen on physical exam.  Recent Labs    09/28/17 1020 09/30/17 0609  HGB 12.0 10.9*  HCT 34.8* 30.6*    Assessment/Plan: Status post Cesarean section. Doing well postoperatively.  Continue current care.  Stephone Gum Bovard-Stuckert 10/01/2017, 8:07 AM

## 2017-10-01 NOTE — Progress Notes (Signed)
CSW acknowledges consult.  CSW attempted to meet with [name] x2.  [name] was in the bathroom the 1st attempts and was not feeling well when CSW returned. CSW will attempt to visit with [name] at a later time.   Blaine HamperAngel Boyd-Gilyard, MSW, LCSW Clinical Social Work [phone]

## 2017-10-02 MED ORDER — IBUPROFEN 600 MG PO TABS: 600.0000 mg | ORAL_TABLET | Freq: Four times a day (QID) | ORAL | 0 refills | Status: DC

## 2017-10-02 MED ORDER — ACETAMINOPHEN 325 MG PO TABS: 650.0000 mg | ORAL_TABLET | ORAL | 0 refills | Status: DC | PRN

## 2017-10-02 NOTE — Discharge Summary (Signed)
OB Discharge Summary     Patient Name: Megan Fields DOB: Aug 05, 1980 MRN: [id number]  Date of admission: 09/29/2017 Delivering MD: Pryor OchoaBANGA, [facility]   Date of discharge: 10/02/2017  Admitting diagnosis: primary c-section, breech Intrauterine pregnancy: [id number]     Secondary diagnosis:  Active Problems:   Status post primary low transverse cesarean section   Postpartum care following cesarean delivery  Additional problems: mild postpartum anxiety     Discharge diagnosis: Term Pregnancy Delivered                                                                                                Post partum procedures:none   Complications: None  Hospital course:  Sceduled C/S   37 y.o. yo [id number] at 2673w2d was admitted to the hospital 09/29/2017 for scheduled cesarean section with the following indication:Malpresentation.  Membrane Rupture Time/Date: 12:56 PM ,09/29/2017   Patient delivered a Viable infant.09/29/2017  Details of operation can be found in separate operative note.  Pateint had an uncomplicated postpartum course.  She is ambulating, tolerating a regular diet, passing flatus, and urinating well. Patient is discharged home in stable condition on  10/02/17         Physical exam  Vitals:   10/01/17 0606 10/01/17 1310 10/01/17 2142 10/02/17 0519  BP: 94/61 95/69 101/63 96/68  Pulse: 86 95 86 96  Resp: 18 16 12 18   Temp: 98.4 F (36.9 C) 99 F (37.2 C) 99.4 F (37.4 C) 98.3 F (36.8 C)  TempSrc: Oral Oral Oral Oral  SpO2:   99% 100%  Weight:      Height:       General: alert and cooperative Lochia: appropriate Uterine Fundus: firm Incision: Dressing is clean, dry, and intact  Labs: Lab Results  Component Value Date   WBC 12.6 (H) 09/30/2017   HGB 10.9 (L) 09/30/2017   HCT 30.6 (L) 09/30/2017   MCV 88.7 09/30/2017   PLT 206 09/30/2017   CMP Latest Ref Rng & Units 05/21/2016  Glucose 65 - 99 mg/dL 469(G112(H)  BUN 6 - 20 mg/dL 12  Creatinine 2.950.44 - 2.841.00 mg/dL  1.320.79  Sodium 440135 - 102145 mmol/L 136  Potassium 3.5 - 5.1 mmol/L 3.8  Chloride 101 - 111 mmol/L 103  CO2 22 - 32 mmol/L 26  Calcium 8.9 - 10.3 mg/dL 9.0  Total Protein 6.0 - 8.3 g/dL -  Total Bilirubin 0.3 - 1.2 mg/dL -  Alkaline Phos 39 - 725117 U/L -  AST 0 - 37 U/L -  ALT 0 - 35 U/L -    Discharge instruction: per After Visit Summary and "Baby and Me Booklet".  After visit meds:  Allergies as of 10/02/2017      Reactions   Shellfish Allergy Anaphylaxis      Medication List    TAKE these medications   acetaminophen 325 MG tablet Commonly known as:  TYLENOL Take 2 tablets (650 mg total) by mouth every 4 (four) hours as needed (for pain scale < 4).   ibuprofen 600 MG tablet Commonly known as:  ADVIL,MOTRIN Take 1 tablet (600  mg total) by mouth every 6 (six) hours.   prenatal multivitamin Tabs tablet Take 1 tablet by mouth daily at 12 noon.       Diet: routine diet  Activity: Advance as tolerated. Pelvic rest for 6 weeks.   Outpatient follow up:2 weeks Follow up Appt:No future appointments. Follow up Visit:No follow-ups on file.  Postpartum contraception: Undecided  Newborn Data: Live born female  Birth Weight: 7 lb 2.3 oz (3240 g) APGAR: 8, 8  Newborn Delivery   Birth date/time:  09/29/2017 12:57:00 Delivery type:  C-Section, Low Transverse Trial of labor:  No C-section categorization:  Primary     Baby Feeding: Bottle and Breast Disposition:home with mother   10/02/2017 Oliver Pila, MD

## 2017-10-02 NOTE — Progress Notes (Signed)
CSW received consult due to score 13 on Edinburgh Depression Screen.  When CSW arrived, [name] was preparing for discharge.  [name] was polite and receptive to meeting with CSW.  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  [name] reported that [name]'s symptoms have been managed with medications and [name] decided to discontinue medication until after delivery.  [name] reported feeling good emotional and feeling prepared to parent. Per [name], [name] has a strong support team and has all the essentials needed for infant.   CSW provided education regarding Baby Blues vs PMADs.  CSW encouraged [name] to evaluate her mental health throughout the postpartum period with the use of the New Mom Checklist developed by Postpartum Progress and notify a medical professional if symptoms arise.  CSW assessed for safety and [name] denied SI and HI.  [name] presented with insight and awareness and did not demonstrate any signs or symptoms of depression/anxiety.  There are no barriers to discharge.  Blaine HamperAngel Fields, MSW, LCSW Clinical Social Work [phone]

## 2017-10-02 NOTE — Progress Notes (Signed)
Subjective: Postpartum Day 3: Cesarean Delivery Patient reports tolerating PO, + BM and no problems voiding.  States feeling much better today after BM.  States some anxiety but feels she has adequate control and declines medications  Objective: Vital signs in last 24 hours: Temp:  [98.3 F (36.8 C)-99.4 F (37.4 C)] 98.3 F (36.8 C) (07/19 0519) Pulse Rate:  [86-96] 96 (07/19 0519) Resp:  [12-18] 18 (07/19 0519) BP: (95-101)/(63-69) 96/68 (07/19 0519) SpO2:  [99 %-100 %] 100 % (07/19 0519)  Physical Exam:  General: alert and cooperative Lochia: appropriate Uterine Fundus: firm Incision: C/D/I   Recent Labs    09/30/17 0609  HGB 10.9*  HCT 30.6*    Assessment/Plan: Status post Cesarean section. Doing well postoperatively.  Discharge home with standard precautions and return to clinic in 2 weeks for incision check and will rescreen depression.  Oliver PilaKathy W Keiosha Cancro 10/02/2017, 9:45 AM

## 2017-10-23 ENCOUNTER — Inpatient Hospital Stay (HOSPITAL_COMMUNITY)
Admission: AD | Admit: 2017-10-23 | Discharge: 2017-10-23 | Disposition: A | Discharge status: Home / Self Care | Payer: Managed Care, Other (non HMO) | Source: Ambulatory Visit | Attending: Obstetrics and Gynecology | Admitting: Obstetrics and Gynecology

## 2017-10-23 ENCOUNTER — Encounter (HOSPITAL_COMMUNITY): Payer: Self-pay | Admitting: *Deleted

## 2017-10-23 DIAGNOSIS — Z9889 Other specified postprocedural states: Secondary | ICD-10-CM | POA: Diagnosis not present

## 2017-10-23 DIAGNOSIS — Z803 Family history of malignant neoplasm of breast: Secondary | ICD-10-CM | POA: Diagnosis not present

## 2017-10-23 DIAGNOSIS — I10 Essential (primary) hypertension: Secondary | ICD-10-CM | POA: Insufficient documentation

## 2017-10-23 DIAGNOSIS — R51 Headache: Secondary | ICD-10-CM | POA: Insufficient documentation

## 2017-10-23 DIAGNOSIS — O1003 Pre-existing essential hypertension complicating the puerperium: Secondary | ICD-10-CM | POA: Diagnosis not present

## 2017-10-23 DIAGNOSIS — Z818 Family history of other mental and behavioral disorders: Secondary | ICD-10-CM | POA: Diagnosis not present

## 2017-10-23 DIAGNOSIS — Z833 Family history of diabetes mellitus: Secondary | ICD-10-CM | POA: Insufficient documentation

## 2017-10-23 DIAGNOSIS — Z8249 Family history of ischemic heart disease and other diseases of the circulatory system: Secondary | ICD-10-CM | POA: Insufficient documentation

## 2017-10-23 DIAGNOSIS — Z79899 Other long term (current) drug therapy: Secondary | ICD-10-CM | POA: Diagnosis not present

## 2017-10-23 DIAGNOSIS — Z91013 Allergy to seafood: Secondary | ICD-10-CM | POA: Diagnosis not present

## 2017-10-23 DIAGNOSIS — H539 Unspecified visual disturbance: Secondary | ICD-10-CM | POA: Diagnosis not present

## 2017-10-23 LAB — URINALYSIS, ROUTINE W REFLEX MICROSCOPIC
BILIRUBIN URINE: NEGATIVE
GLUCOSE, UA: NEGATIVE mg/dL
KETONES UR: NEGATIVE mg/dL
LEUKOCYTES UA: NEGATIVE
Nitrite: NEGATIVE
PROTEIN: NEGATIVE mg/dL
Specific Gravity, Urine: 1.005 (ref 1.005–1.030)
pH: 6 (ref 5.0–8.0)

## 2017-10-23 LAB — CBC
HCT: 37.9 % (ref 36.0–46.0)
Hemoglobin: 13.1 g/dL (ref 12.0–15.0)
MCH: 31 pg (ref 26.0–34.0)
MCHC: 34.6 g/dL (ref 30.0–36.0)
MCV: 89.6 fL (ref 78.0–100.0)
PLATELETS: 311 10*3/uL (ref 150–400)
RBC: 4.23 MIL/uL (ref 3.87–5.11)
RDW: 12.6 % (ref 11.5–15.5)
WBC: 7.1 10*3/uL (ref 4.0–10.5)

## 2017-10-23 LAB — COMPREHENSIVE METABOLIC PANEL
ALBUMIN: 3.2 g/dL — AB (ref 3.5–5.0)
ALT: 29 U/L (ref 0–44)
AST: 26 U/L (ref 15–41)
Alkaline Phosphatase: 96 U/L (ref 38–126)
Anion gap: 11 (ref 5–15)
BUN: 7 mg/dL (ref 6–20)
CHLORIDE: 104 mmol/L (ref 98–111)
CO2: 21 mmol/L — AB (ref 22–32)
Calcium: 8.4 mg/dL — ABNORMAL LOW (ref 8.9–10.3)
Creatinine, Ser: 0.78 mg/dL (ref 0.44–1.00)
GFR calc Af Amer: >60 mL/min (ref >60)
GFR calc non Af Amer: >60 mL/min (ref >60)
GLUCOSE: 119 mg/dL — AB (ref 70–99)
POTASSIUM: 3.2 mmol/L — AB (ref 3.5–5.1)
Sodium: 136 mmol/L (ref 135–145)
Total Bilirubin: 0.5 mg/dL (ref 0.3–1.2)
Total Protein: 6.8 g/dL (ref 6.5–8.1)

## 2017-10-23 LAB — PROTEIN / CREATININE RATIO, URINE
CREATININE, URINE: 61 mg/dL
Total Protein, Urine: <6 mg/dL

## 2017-10-23 MED ORDER — LABETALOL HCL 5 MG/ML IV SOLN: 40.0000 mg | INTRAVENOUS | Status: DC | PRN

## 2017-10-23 MED ORDER — HYDROCHLOROTHIAZIDE 25 MG PO TABS: 25.0000 mg | ORAL_TABLET | Freq: Every day | ORAL | 3 refills | Status: DC

## 2017-10-23 MED ORDER — NIFEDIPINE 10 MG PO CAPS
10.0000 mg | ORAL_CAPSULE | ORAL | Status: DC | PRN
  Administered 2017-10-23: 10 mg via ORAL
  Filled 2017-10-23: qty 1

## 2017-10-23 MED ORDER — NIFEDIPINE 10 MG PO CAPS
20.0000 mg | ORAL_CAPSULE | ORAL | Status: DC | PRN
  Administered 2017-10-23: 20 mg via ORAL

## 2017-10-23 MED ORDER — HYDROCHLOROTHIAZIDE 12.5 MG PO CAPS
12.5000 mg | ORAL_CAPSULE | Freq: Once | ORAL | Status: AC
  Administered 2017-10-23: 12.5 mg via ORAL
  Filled 2017-10-23: qty 1

## 2017-10-23 MED ORDER — NIFEDIPINE 10 MG PO CAPS
20.0000 mg | ORAL_CAPSULE | ORAL | Status: DC | PRN
  Filled 2017-10-23: qty 2

## 2017-10-23 NOTE — MAU Note (Addendum)
Had primary c/s 09/29/17 due to baby being breech. Hx chronic HTN. ON meds (HCTZ)  before pregnancy but not during pregnancy. Was not put back on meds pp. Has h/a today and just not feeling well. Calves tingle bilaterally. BP 135/108 at home. Took 800mg  Ibuprofen and helped h/a

## 2017-10-23 NOTE — MAU Note (Addendum)
Pt stated she felt alittle "weird" like heart racing few mins ago but feels better now. Margarita MailW. karim CNM notified

## 2017-10-23 NOTE — MAU Provider Note (Addendum)
History     CSN: [id number]  Arrival date and time: 10/23/17 1945   First Provider Initiated Contact with Patient 10/23/17 2032      Chief Complaint  Patient presents with  . Hypertension  . Headache   HPI  Megan Fields is a 37 y.o. [id number] postpartum patient who presents to MAU with chief complaint of elevated blood pressure 130/100 at home today.   Elevated BP This is a new problem. Patient has Chronic HTN on her problem list but has not been on medication for this diagnosis since becoming pregnant. States she had an unrelieved headache, took her BP at home, and found it to be 130/100.  Headache This is a new problem. Patient reports headache 10/10 today which worsened when she laid down. Pain is frontal, does not radiate, aggravated by position changes, alleviated with 800 mg PO Ibuprofen taken earlier today.   Visual Disturbances New problem. New problem, onset coincides with headache. Patient reports she "might have seen" spots in her field of vision today but she was driving at the time and "it might have been the sun shining in my eyes".  OB History    Gravida  3   Para  2   Term  2   Preterm      AB  1   Living  2     SAB  1   TAB      Ectopic      Multiple  0   Live Births  2           Past Medical History:  Diagnosis Date  . Anxiety   . Depression   . Hearing loss in right ear   . Hypertension     Past Surgical History:  Procedure Laterality Date  . CESAREAN SECTION N/A 09/29/2017   Procedure: CESAREAN SECTION;  Surgeon: Edwinna Areola, DO;  Location: [facility];  Service: Obstetrics;  Laterality: N/A;  Heather  RNFA  . CYST REMOVAL TRUNK Right 12/1996   Boil/cyst removed right at side at waist line  . DILATION AND CURETTAGE OF UTERUS    . DILATION AND EVACUATION N/A 05/21/2016   Procedure: DILATATION AND EVACUATION WITH ULTRASOUND GUIDANCE;  Surgeon: Edwinna Areola, DO;  Location: [facility];  Service: Gynecology;   Laterality: N/A;    Family History  Problem Relation Age of Onset  . Diabetes type II Mother   . Hypertension Mother   . Anxiety disorder Father   . Depression Father   . Breast cancer Maternal Aunt 80    Social History   Tobacco Use  . Smoking status: Never Smoker  . Smokeless tobacco: Never Used  Substance Use Topics  . Alcohol use: Yes    Comment: 1 glass a month, not while preg  . Drug use: No    Allergies:  Allergies  Allergen Reactions  . Shellfish Allergy Anaphylaxis    Medications Prior to Admission  Medication Sig Dispense Refill Last Dose  . acetaminophen (TYLENOL) 325 MG tablet Take 2 tablets (650 mg total) by mouth every 4 (four) hours as needed (for pain scale < 4). 30 tablet 0   . ibuprofen (ADVIL,MOTRIN) 600 MG tablet Take 1 tablet (600 mg total) by mouth every 6 (six) hours. 30 tablet 0   . Prenatal Vit-Fe Fumarate-FA (PRENATAL MULTIVITAMIN) TABS tablet Take 1 tablet by mouth daily at 12 noon.   07/27/2017 at Unknown time    Review of Systems  Constitutional: Negative for  fever.  Respiratory: Negative for shortness of breath.   Gastrointestinal: Negative for abdominal pain.  Musculoskeletal: Negative for back pain.  Neurological: Positive for headaches. Negative for syncope.   Physical Exam   Blood pressure 125/76, pulse (!) 117, temperature 98.3 F (36.8 C), resp. rate 18, height 5\' 3"  (1.6 m), weight 82.6 kg, SpO2 98 %, not currently breastfeeding.  Physical Exam  Nursing note and vitals reviewed. Constitutional: She is oriented to person, place, and time. She appears well-developed and well-nourished.  HENT:  Head: Normocephalic.  Cardiovascular: Normal rate, regular rhythm, normal heart sounds and intact distal pulses.  Respiratory: Effort normal. No respiratory distress.  GI: Soft.  Musculoskeletal: Normal range of motion.  Neurological: She is alert and oriented to person, place, and time. She has normal reflexes.  Skin: Skin is warm and  dry.  Psychiatric: She has a normal mood and affect. Her behavior is normal. Judgment and thought content normal.    MAU Course  Procedures  MDM --Patient no longer breastfeeding --Normal results on PEC labs collected in MAU --Discussed pre-pregnancy HCTZ regimen with Dr. Ellyn HackBovard, who requested order for HCTZ to be given in MAU --Normotensive s/p Procardia and HCTZ  Patient Vitals for the past 24 hrs:  BP Temp Pulse Resp SpO2 Height Weight  10/23/17 2230 125/76 - (!) 117 - 98 % - -  10/23/17 2217 121/70 - (!) 134 - - - -  10/23/17 2215 (!) 116/59 - (!) 148 - - - -  10/23/17 2204 127/77 - (!) 111 - - - -  10/23/17 2145 (!) 157/98 - 75 - - - -  10/23/17 2130 (!) 168/101 - 64 - - - -  10/23/17 2115 (!) 159/97 - 69 - - - -  10/23/17 2100 (!) 171/92 - 67 - - - -  10/23/17 2045 (!) 158/100 - 69 - - - -  10/23/17 2033 (!) 161/95 - 69 - - - -  10/23/17 2010 (!) 153/95 - 72 - 99 % - -  10/23/17 2009 - 98.3 F (36.8 C) - 18 - - -  10/23/17 2006 - - - - - 5\' 3"  (1.6 m) 82.6 kg    Orders Placed This Encounter  Procedures  . Urinalysis, Routine w reflex microscopic  . CBC  . Comprehensive metabolic panel  . Protein / creatinine ratio, urine  . Vital signs  . Notify Physician  . Measure blood pressure  . Pulse oximetry check with vital signs   Results for orders placed or performed during the hospital encounter of 10/23/17 (from the past 24 hour(s))  Urinalysis, Routine w reflex microscopic     Status: Abnormal   Collection Time: 10/23/17  8:30 PM  Result Value Ref Range   Color, Urine STRAW (A) YELLOW   APPearance CLEAR CLEAR   Specific Gravity, Urine 1.005 1.005 - 1.030   pH 6.0 5.0 - 8.0   Glucose, UA NEGATIVE NEGATIVE mg/dL   Hgb urine dipstick MODERATE (A) NEGATIVE   Bilirubin Urine NEGATIVE NEGATIVE   Ketones, ur NEGATIVE NEGATIVE mg/dL   Protein, ur NEGATIVE NEGATIVE mg/dL   Nitrite NEGATIVE NEGATIVE   Leukocytes, UA NEGATIVE NEGATIVE   RBC / HPF 0-5 0 - 5 RBC/hpf    WBC, UA 0-5 0 - 5 WBC/hpf   Bacteria, UA RARE (A) NONE SEEN   Squamous Epithelial / LPF 0-5 0 - 5  Protein / creatinine ratio, urine     Status: None   Collection Time: 10/23/17  8:30 PM  Result Value Ref Range   Creatinine, Urine 61.00 mg/dL   Total Protein, Urine <6 mg/dL   Protein Creatinine Ratio        0.00 - 0.15 mg/mg[Cre]  CBC     Status: None   Collection Time: 10/23/17  8:48 PM  Result Value Ref Range   WBC 7.1 4.0 - 10.5 K/uL   RBC 4.23 3.87 - 5.11 MIL/uL   Hemoglobin 13.1 12.0 - 15.0 g/dL   HCT 16.1 09.6 - 04.5 %   MCV 89.6 78.0 - 100.0 fL   MCH 31.0 26.0 - 34.0 pg   MCHC 34.6 30.0 - 36.0 g/dL   RDW 40.9 81.1 - 91.4 %   Platelets 311 150 - 400 K/uL  Comprehensive metabolic panel     Status: Abnormal   Collection Time: 10/23/17  8:48 PM  Result Value Ref Range   Sodium 136 135 - 145 mmol/L   Potassium 3.2 (L) 3.5 - 5.1 mmol/L   Chloride 104 98 - 111 mmol/L   CO2 21 (L) 22 - 32 mmol/L   Glucose, Bld 119 (H) 70 - 99 mg/dL   BUN 7 6 - 20 mg/dL   Creatinine, Ser 7.82 0.44 - 1.00 mg/dL   Calcium 8.4 (L) 8.9 - 10.3 mg/dL   Total Protein 6.8 6.5 - 8.1 g/dL   Albumin 3.2 (L) 3.5 - 5.0 g/dL   AST 26 15 - 41 U/L   ALT 29 0 - 44 U/L   Alkaline Phosphatase 96 38 - 126 U/L   Total Bilirubin 0.5 0.3 - 1.2 mg/dL   GFR calc non Af Amer >60 >60 mL/min   GFR calc Af Amer >60 >60 mL/min   Anion gap 11 5 - 15    Report given to Margarita Mail, CNM  Clayton Bibles, CNM 10/23/17  10:56 PM     2250 Pt reassessed; denies  headache, vision changes, or epigastric pain. Desires discharge home.  Consulted with Dr. Ellyn Hack, reviewed vital signs, symptoms, lab results > agrees with plan to discharge home with HCTZ and  follow-up in office next week.  Assessment and Plan  Chronic Hypertension Preeclampsia Evaluation  Plan: Discharge home Reviewed signs of preeclampsia RX HCTZ 25 mg QD Follow-up in office next week  Marlis Edelson, CNM

## 2017-10-23 NOTE — Discharge Instructions (Signed)
Postpartum Hypertension °Postpartum hypertension is high blood pressure after pregnancy that remains higher than normal for more than two days after delivery. You may not realize that you have postpartum hypertension if your blood pressure is not being checked regularly. In some cases, postpartum hypertension will go away on its own, usually within a week of delivery. However, for some women, medical treatment is required to prevent serious complications, such as seizures or stroke. °The following things can affect your blood pressure: °· The type of delivery you had. °· Having received IV fluids or other medicines during or after delivery. ° °What are the causes? °Postpartum hypertension may be caused by any of the following or by a combination of any of the following: °· Hypertension that existed before pregnancy (chronic hypertension). °· Gestational hypertension. °· Preeclampsia or eclampsia. °· Receiving a lot of fluid through an IV during or after delivery. °· Medicines. °· HELLP syndrome. °· Hyperthyroidism. °· Stroke. °· Other rare neurological or blood disorders. ° °In some cases, the cause may not be known. °What increases the risk? °Postpartum hypertension can be related to one or more risk factors, such as: °· Chronic hypertension. In some cases, this may not have been diagnosed before pregnancy. °· Obesity. °· Type 2 diabetes. °· Kidney disease. °· Family history of preeclampsia. °· Other medical conditions that cause hormonal imbalances. ° °What are the signs or symptoms? °As with all types of hypertension, postpartum hypertension may not have any symptoms. Depending on how high your blood pressure is, you may experience: °· Headaches. These may be mild, moderate, or severe. They may also be steady, constant, or sudden in onset (thunderclap headache). °· Visual changes. °· Dizziness. °· Shortness of breath. °· Swelling of your hands, feet, lower legs, or face. In some cases, you may have swelling in  more than one of these locations. °· Heart palpitations or a racing heartbeat. °· Difficulty breathing while lying down. °· Decreased urination. ° °Other rare signs and symptoms may include: °· Sweating more than usual. This lasts longer than a few days after delivery. °· Chest pain. °· Sudden dizziness when you get up from sitting or lying down. °· Seizures. °· Nausea or vomiting. °· Abdominal pain. ° °How is this diagnosed? °The diagnosis of postpartum hypertension is made through a combination of physical examination findings and testing of your blood and urine. You may also have additional tests, such as a CT scan or an MRI, to check for other complications of postpartum hypertension. °How is this treated? °When blood pressure is high enough to require treatment, your options may include: °· Medicines to reduce blood pressure (antihypertensives). Tell your health care provider if you are breastfeeding or if you plan to breastfeed. There are many antihypertensive medicines that are safe to take while breastfeeding. °· Stopping medicines that may be causing hypertension. °· Treating medical conditions that are causing hypertension. °· Treating the complications of hypertension, such as seizures, stroke, or kidney problems. ° °Your health care provider will also continue to monitor your blood pressure closely and repeatedly until it is within a safe range for you. °Follow these instructions at home: °· Take medicines only as directed by your health care provider. °· Get regular exercise after your health care provider tells you that it is safe. °· Follow your health care provider’s recommendations on fluid and salt restrictions. °· Do not use any tobacco products, including cigarettes, chewing tobacco, or electronic cigarettes. If you need help quitting, ask your health care provider. °·   Keep all follow-up visits as directed by your health care provider. This is important. °Contact a health care provider  if: °· Your symptoms get worse. °· You have new symptoms, such as: °? Headache. °? Dizziness. °? Visual changes. °Get help right away if: °· You develop a severe or sudden headache. °· You have seizures. °· You develop numbness or weakness on one side of your body. °· You have difficulty thinking, speaking, or swallowing. °· You develop severe abdominal pain. °· You develop difficulty breathing, chest pain, a racing heartbeat, or heart palpitations. °These symptoms may represent a serious problem that is an emergency. Do not wait to see if the symptoms will go away. Get medical help right away. Call your local emergency services (911 in the [place] Do not drive yourself to the hospital. °This information is not intended to replace advice given to you by your health care provider. Make sure you discuss any questions you have with your health care provider. °Document Released: 11/04/2013 Document Revised: 08/06/2015 Document Reviewed: 09/15/2013 °Elsevier Interactive Patient Education © 2018 [employer] ° °

## 2018-07-07 DIAGNOSIS — J301 Allergic rhinitis due to pollen: Secondary | ICD-10-CM | POA: Insufficient documentation

## 2018-09-08 DIAGNOSIS — J4520 Mild intermittent asthma, uncomplicated: Secondary | ICD-10-CM | POA: Insufficient documentation

## 2019-04-05 DIAGNOSIS — F324 Major depressive disorder, single episode, in partial remission: Secondary | ICD-10-CM | POA: Insufficient documentation

## 2019-08-30 ENCOUNTER — Ambulatory Visit: Payer: 59 | Admitting: Psychiatry

## 2020-07-26 DIAGNOSIS — J45909 Unspecified asthma, uncomplicated: Secondary | ICD-10-CM | POA: Insufficient documentation

## 2020-07-26 DIAGNOSIS — L309 Dermatitis, unspecified: Secondary | ICD-10-CM | POA: Insufficient documentation

## 2020-07-26 DIAGNOSIS — F331 Major depressive disorder, recurrent, moderate: Secondary | ICD-10-CM | POA: Insufficient documentation

## 2020-10-08 DIAGNOSIS — L821 Other seborrheic keratosis: Secondary | ICD-10-CM | POA: Insufficient documentation

## 2020-10-08 DIAGNOSIS — L853 Xerosis cutis: Secondary | ICD-10-CM | POA: Insufficient documentation

## 2020-11-12 DIAGNOSIS — N920 Excessive and frequent menstruation with regular cycle: Secondary | ICD-10-CM | POA: Insufficient documentation

## 2020-11-13 DIAGNOSIS — R7303 Prediabetes: Secondary | ICD-10-CM | POA: Insufficient documentation

## 2021-01-17 DIAGNOSIS — M5432 Sciatica, left side: Secondary | ICD-10-CM | POA: Insufficient documentation

## 2021-06-12 DIAGNOSIS — E282 Polycystic ovarian syndrome: Secondary | ICD-10-CM | POA: Insufficient documentation

## 2021-09-16 LAB — OB RESULTS CONSOLE HEPATITIS B SURFACE ANTIGEN: Hepatitis B Surface Ag: NEGATIVE

## 2021-09-16 LAB — OB RESULTS CONSOLE RUBELLA ANTIBODY, IGM: Rubella: IMMUNE

## 2021-09-16 LAB — OB RESULTS CONSOLE GC/CHLAMYDIA
Chlamydia: NEGATIVE
Neisseria Gonorrhea: NEGATIVE

## 2021-09-16 LAB — OB RESULTS CONSOLE ABO/RH: RH Type: POSITIVE

## 2021-09-16 LAB — OB RESULTS CONSOLE HIV ANTIBODY (ROUTINE TESTING): HIV: NONREACTIVE

## 2021-09-16 LAB — OB RESULTS CONSOLE RPR: RPR: NONREACTIVE

## 2021-09-16 LAB — OB RESULTS CONSOLE ANTIBODY SCREEN: Antibody Screen: NEGATIVE

## 2022-02-19 ENCOUNTER — Encounter: Payer: Managed Care, Other (non HMO) | Attending: Obstetrics and Gynecology | Admitting: Registered"

## 2022-02-19 DIAGNOSIS — O24419 Gestational diabetes mellitus in pregnancy, unspecified control: Secondary | ICD-10-CM | POA: Insufficient documentation

## 2022-02-20 ENCOUNTER — Encounter: Payer: Self-pay | Admitting: Registered"

## 2022-02-20 DIAGNOSIS — O24419 Gestational diabetes mellitus in pregnancy, unspecified control: Secondary | ICD-10-CM | POA: Insufficient documentation

## 2022-02-20 NOTE — Progress Notes (Signed)
Patient was seen on 02/19/22 for Gestational Diabetes self-management class at the [facility]. The following learning objectives were met by the patient during this course:  States the definition of Gestational Diabetes States why dietary management is important in controlling blood glucose Describes the effects each nutrient has on blood glucose levels Demonstrates ability to create a balanced meal plan Demonstrates carbohydrate counting  States when to check blood glucose levels Demonstrates proper blood glucose monitoring techniques States the effect of stress and exercise on blood glucose levels States the importance of limiting caffeine and abstaining from alcohol and smoking  Blood glucose monitor given: None. Patient has meter and checking blood sugar prior to class.  Patient instructed to monitor glucose levels: FBS: 60 - <95; 1 hour: <140; 2 hour: <120  Patient received handouts: Nutrition Diabetes and Pregnancy, including carb counting list  Patient will be seen for follow-up as needed.

## 2022-03-18 ENCOUNTER — Encounter (HOSPITAL_COMMUNITY): Payer: Self-pay

## 2022-03-18 ENCOUNTER — Other Ambulatory Visit: Payer: Self-pay

## 2022-03-18 ENCOUNTER — Inpatient Hospital Stay (HOSPITAL_COMMUNITY)
Admission: AD | Admit: 2022-03-18 | Discharge: 2022-03-18 | Disposition: A | Discharge status: Home / Self Care | Payer: Managed Care, Other (non HMO) | Attending: Obstetrics and Gynecology | Admitting: Obstetrics and Gynecology

## 2022-03-18 DIAGNOSIS — O99513 Diseases of the respiratory system complicating pregnancy, third trimester: Secondary | ICD-10-CM | POA: Insufficient documentation

## 2022-03-18 DIAGNOSIS — Z3A35 35 weeks gestation of pregnancy: Secondary | ICD-10-CM | POA: Diagnosis not present

## 2022-03-18 DIAGNOSIS — R102 Pelvic and perineal pain: Secondary | ICD-10-CM | POA: Diagnosis not present

## 2022-03-18 DIAGNOSIS — O10913 Unspecified pre-existing hypertension complicating pregnancy, third trimester: Secondary | ICD-10-CM | POA: Diagnosis not present

## 2022-03-18 DIAGNOSIS — O26893 Other specified pregnancy related conditions, third trimester: Secondary | ICD-10-CM | POA: Diagnosis not present

## 2022-03-18 DIAGNOSIS — O24415 Gestational diabetes mellitus in pregnancy, controlled by oral hypoglycemic drugs: Secondary | ICD-10-CM | POA: Diagnosis not present

## 2022-03-18 HISTORY — DX: Unspecified asthma, uncomplicated: J45.909

## 2022-03-18 LAB — URINALYSIS, ROUTINE W REFLEX MICROSCOPIC
Bilirubin Urine: NEGATIVE
Glucose, UA: NEGATIVE mg/dL
Hgb urine dipstick: NEGATIVE
Ketones, ur: NEGATIVE mg/dL
Leukocytes,Ua: NEGATIVE
Nitrite: NEGATIVE
Protein, ur: NEGATIVE mg/dL
Specific Gravity, Urine: 1.004 — ABNORMAL LOW (ref 1.005–1.030)
pH: 5 (ref 5.0–8.0)

## 2022-03-18 NOTE — MAU Provider Note (Signed)
History     CSN: [id number]  Arrival date and time: 03/18/22 1211   Event Date/Time   First Provider Initiated Contact with Patient 03/18/22 1255      Chief Complaint  Patient presents with   Pelvic Pain   Pelvic Pain The patient's primary symptoms include pelvic pain. Pertinent negatives include no abdominal pain, back pain, chills, diarrhea, dysuria, fever, flank pain, nausea, rash, sore throat or vomiting.    Patient is 42 y.o. [id number] [redacted]w[redacted]d here with complaints of pelvic pressure and pain over prior CS scar. Pregnancy is complicated by O9GEX, AMA. Reports sudden onset of pelvic pressure today with pain. Located over CS scar but is across lower abdomen. Painful to move and bend. Reports increased fatigue yesterday. Has appt today at the office but called given her sx and they told her to come here for evaluation. Patient has not decided on repeat CS vs TOLAC  Seen by [facility]  +FM, denies LOF, VB, contractions, vaginal discharge.   OB History     Gravida  5   Para  2   Term  2   Preterm      AB  2   Living  2      SAB  2   IAB      Ectopic      Multiple  0   Live Births  2           Past Medical History:  Diagnosis Date   Anxiety    Asthma    Depression    Hearing loss in right ear    Hypertension     Past Surgical History:  Procedure Laterality Date   CESAREAN SECTION N/A 09/29/2017   Procedure: CESAREAN SECTION;  Surgeon: Sherlyn Hay, DO;  Location: [facility];  Service: Obstetrics;  Laterality: N/A;  Heather  RNFA   CYST REMOVAL TRUNK Right 12/1996   Boil/cyst removed right at side at waist line   DILATION AND CURETTAGE OF UTERUS     DILATION AND EVACUATION N/A 05/21/2016   Procedure: DILATATION AND EVACUATION WITH ULTRASOUND GUIDANCE;  Surgeon: Sherlyn Hay, DO;  Location: [facility];  Service: Gynecology;  Laterality: N/A;    Family History  Problem Relation Age of Onset   Diabetes type II Mother     Hypertension Mother    Anxiety disorder Father    Depression Father    Breast cancer Maternal Aunt 42    Social History   Tobacco Use   Smoking status: Never   Smokeless tobacco: Never  Substance Use Topics   Alcohol use: Yes    Comment: 1 glass a month, not while preg   Drug use: No    Allergies:  Allergies  Allergen Reactions   Shellfish Allergy Anaphylaxis    Medications Prior to Admission  Medication Sig Dispense Refill Last Dose   acetaminophen (TYLENOL) 325 MG tablet Take 2 tablets (650 mg total) by mouth every 4 (four) hours as needed (for pain scale < 4). 30 tablet 0 03/18/2022   aspirin EC 81 MG tablet Take 81 mg by mouth daily. Swallow whole.   03/18/2022 at 0730   hydrochlorothiazide (HYDRODIURIL) 25 MG tablet Take 1 tablet (25 mg total) by mouth daily. 30 tablet 3 03/18/2022 at 0730   metFORMIN (GLUCOPHAGE-XR) 500 MG 24 hr tablet Take 500 mg by mouth daily with breakfast.   03/17/2022 at 2100   Prenatal Vit-Fe Fumarate-FA (PRENATAL MULTIVITAMIN) TABS tablet Take 1 tablet by  mouth daily at 12 noon.   03/18/2022 at 0730   ibuprofen (ADVIL,MOTRIN) 600 MG tablet Take 1 tablet (600 mg total) by mouth every 6 (six) hours. 30 tablet 0     Review of Systems  Constitutional:  Negative for chills and fever.  HENT:  Negative for congestion and sore throat.   Eyes:  Negative for pain and visual disturbance.  Respiratory:  Negative for cough, chest tightness and shortness of breath.   Cardiovascular:  Negative for chest pain.  Gastrointestinal:  Negative for abdominal pain, diarrhea, nausea and vomiting.  Endocrine: Negative for cold intolerance and heat intolerance.  Genitourinary:  Positive for pelvic pain. Negative for dysuria and flank pain.  Musculoskeletal:  Negative for back pain.  Skin:  Negative for rash.  Allergic/Immunologic: Negative for food allergies.  Neurological:  Negative for dizziness and light-headedness.  Psychiatric/Behavioral:  Negative for agitation.     Physical Exam   Blood pressure 99/76, pulse (!) 115, temperature 98.4 F (36.9 C), temperature source Oral.  Physical Exam Vitals and nursing note reviewed.  Constitutional:      Appearance: She is well-developed.  HENT:     Head: Normocephalic and atraumatic.  Eyes:     Conjunctiva/sclera: Conjunctivae normal.  Cardiovascular:     Rate and Rhythm: Normal rate.  Pulmonary:     Effort: Pulmonary effort is normal.  Abdominal:     Palpations: Abdomen is soft.     Tenderness: There is abdominal tenderness (over pubic symphyis and with leopold's).     Comments: Gravid 35 weeks Leopold's feel like vertex but difficult to palpate fully.   Musculoskeletal:        General: Normal range of motion.     Cervical back: Normal range of motion and neck supple.  Skin:    General: Skin is warm and dry.     Findings: No erythema.  Neurological:     Mental Status: She is alert and oriented to person, place, and time.    Dilation: Closed Effacement (%): 0 Cervical Position: Posterior Station: -3 Presentation: Vertex Exam by:: Lauretta Chester MD   MAU Course  Procedures NST: 140/moderate/+accels, no decels  MDM Moderate  Performed limited bedside US to check fetal presentation due to difficulty with Leopold's due to patient discomfort. Confirmed on [place] that infant is vertex with fetal spine on maternal right. Infant is low in the pelvis.   Low likelihood of uterine rupture low given very reassuring NST today Additionally patient needed ante monitoring for A2GDm which was scheduled today at her OB office. NST today was very reassuring.   Assessment and Plan  #Pelvic pain-- likely fetal position related. Discharge home with precautions for uterine rupture (worsening and more severe pain). Has follow up with primary OB on 1/8.  Juanita Craver Ry Moody 03/18/2022, 1:23 PM

## 2022-03-18 NOTE — MAU Note (Signed)
Megan Fields is a 42 y.o. at [redacted]w[redacted]d here in MAU reporting: constant pelvic pain and tightness in lower abdomen since 0300 03/18/22. States pain is at C/S scar. Denies LOF, bleeding or recent intercourse. Pain 8/10 with ambulation 5/10 when at rest. 325mg  tylenol at 1000 with no relief   FHT: 135 bpm Lab orders placed from triage: UA

## 2022-03-24 LAB — OB RESULTS CONSOLE GBS: GBS: NEGATIVE

## 2022-03-31 ENCOUNTER — Encounter (HOSPITAL_COMMUNITY): Payer: Self-pay

## 2022-03-31 NOTE — Patient Instructions (Addendum)
TRAN RANDLE  03/31/2022   Your procedure is scheduled on:  04/14/2022  Arrive at 1200 at Entrance C on [address] at [facility]  and [employer]. You are invited to use the FREE valet parking or use the Visitor's parking deck.  Pick up the phone at the desk and dial [phone].  Call this number if you have problems the morning of surgery: [phone]  Remember:   Do not eat food:(After Midnight) Desps de medianoche.  Do not drink clear liquids: (4 Hours before arrival) 4 horas ante llegada.0800  Take these medicines the morning of surgery with A SIP OF WATER:  Take singulair and hydrochlorothiazide as prescribed   Do not wear jewelry, make-up or nail polish.  Do not wear lotions, powders, or perfumes. Do not wear deodorant.  Do not shave 48 hours prior to surgery.  Do not bring valuables to the hospital.  [facility] is not   responsible for any belongings or valuables brought to the hospital.  Contacts, dentures or bridgework may not be worn into surgery.  Leave suitcase in the car. After surgery it may be brought to your room.  For patients admitted to the hospital, checkout time is 11:00 AM the day of              discharge.      Please read over the following fact sheets that you were given:     Preparing for Surgery

## 2022-04-11 ENCOUNTER — Encounter (HOSPITAL_COMMUNITY)
Admission: RE | Admit: 2022-04-11 | Discharge: 2022-04-11 | Disposition: A | Discharge status: Home / Self Care | Payer: Managed Care, Other (non HMO) | Source: Ambulatory Visit | Attending: Obstetrics and Gynecology | Admitting: Obstetrics and Gynecology

## 2022-04-11 DIAGNOSIS — Z98891 History of uterine scar from previous surgery: Secondary | ICD-10-CM

## 2022-04-11 DIAGNOSIS — Z01812 Encounter for preprocedural laboratory examination: Secondary | ICD-10-CM | POA: Insufficient documentation

## 2022-04-11 HISTORY — DX: Gestational diabetes mellitus in pregnancy, unspecified control: O24.419

## 2022-04-11 LAB — CBC
HCT: 35.6 % — ABNORMAL LOW (ref 36.0–46.0)
Hemoglobin: 12.6 g/dL (ref 12.0–15.0)
MCH: 31.6 pg (ref 26.0–34.0)
MCHC: 35.4 g/dL (ref 30.0–36.0)
MCV: 89.2 fL (ref 80.0–100.0)
Platelets: 233 10*3/uL (ref 150–400)
RBC: 3.99 MIL/uL (ref 3.87–5.11)
RDW: 12.6 % (ref 11.5–15.5)
WBC: 7 10*3/uL (ref 4.0–10.5)
nRBC: 0 % (ref 0.0–0.2)

## 2022-04-12 LAB — RPR: RPR Ser Ql: NONREACTIVE

## 2022-04-13 ENCOUNTER — Encounter (HOSPITAL_COMMUNITY): Payer: Self-pay | Admitting: Obstetrics and Gynecology

## 2022-04-14 ENCOUNTER — Inpatient Hospital Stay (HOSPITAL_COMMUNITY): Payer: Managed Care, Other (non HMO)

## 2022-04-14 ENCOUNTER — Inpatient Hospital Stay (HOSPITAL_COMMUNITY): Payer: Managed Care, Other (non HMO) | Admitting: Anesthesiology

## 2022-04-14 ENCOUNTER — Encounter (HOSPITAL_COMMUNITY)
Admission: RE | Disposition: A | Discharge status: Home / Self Care | Payer: Self-pay | Source: Home / Self Care | Attending: Obstetrics and Gynecology

## 2022-04-14 ENCOUNTER — Other Ambulatory Visit: Payer: Self-pay

## 2022-04-14 ENCOUNTER — Inpatient Hospital Stay (HOSPITAL_COMMUNITY)
Admission: RE | Admit: 2022-04-14 | Discharge: 2022-04-17 | DRG: 787 | Disposition: A | Discharge status: Home / Self Care | Payer: Managed Care, Other (non HMO) | Attending: Obstetrics and Gynecology | Admitting: Obstetrics and Gynecology

## 2022-04-14 ENCOUNTER — Encounter (HOSPITAL_COMMUNITY): Payer: Self-pay | Admitting: Obstetrics and Gynecology

## 2022-04-14 DIAGNOSIS — O99284 Endocrine, nutritional and metabolic diseases complicating childbirth: Secondary | ICD-10-CM | POA: Diagnosis present

## 2022-04-14 DIAGNOSIS — Z3A39 39 weeks gestation of pregnancy: Secondary | ICD-10-CM

## 2022-04-14 DIAGNOSIS — E876 Hypokalemia: Secondary | ICD-10-CM | POA: Diagnosis present

## 2022-04-14 DIAGNOSIS — D62 Acute posthemorrhagic anemia: Secondary | ICD-10-CM | POA: Diagnosis not present

## 2022-04-14 DIAGNOSIS — O1002 Pre-existing essential hypertension complicating childbirth: Secondary | ICD-10-CM | POA: Diagnosis present

## 2022-04-14 DIAGNOSIS — O34211 Maternal care for low transverse scar from previous cesarean delivery: Secondary | ICD-10-CM | POA: Diagnosis present

## 2022-04-14 DIAGNOSIS — K922 Gastrointestinal hemorrhage, unspecified: Secondary | ICD-10-CM | POA: Diagnosis not present

## 2022-04-14 DIAGNOSIS — O9902 Anemia complicating childbirth: Secondary | ICD-10-CM | POA: Diagnosis present

## 2022-04-14 DIAGNOSIS — O24425 Gestational diabetes mellitus in childbirth, controlled by oral hypoglycemic drugs: Secondary | ICD-10-CM | POA: Diagnosis present

## 2022-04-14 DIAGNOSIS — Z3A40 40 weeks gestation of pregnancy: Secondary | ICD-10-CM

## 2022-04-14 DIAGNOSIS — O1092 Unspecified pre-existing hypertension complicating childbirth: Secondary | ICD-10-CM

## 2022-04-14 DIAGNOSIS — Z98891 History of uterine scar from previous surgery: Secondary | ICD-10-CM

## 2022-04-14 DIAGNOSIS — O9952 Diseases of the respiratory system complicating childbirth: Secondary | ICD-10-CM | POA: Diagnosis present

## 2022-04-14 DIAGNOSIS — J45909 Unspecified asthma, uncomplicated: Secondary | ICD-10-CM | POA: Diagnosis present

## 2022-04-14 DIAGNOSIS — O2686 Pruritic urticarial papules and plaques of pregnancy (PUPPP): Secondary | ICD-10-CM | POA: Diagnosis present

## 2022-04-14 HISTORY — PX: IR US GUIDE VASC ACCESS RIGHT: IMG2390

## 2022-04-14 HISTORY — PX: LAPAROTOMY: SHX154

## 2022-04-14 HISTORY — PX: IR ANGIOGRAM SELECTIVE EACH ADDITIONAL VESSEL: IMG667

## 2022-04-14 HISTORY — PX: IR ANGIOGRAM PELVIS SELECTIVE OR SUPRASELECTIVE: IMG661

## 2022-04-14 HISTORY — PX: IR EMBO ART  VEN HEMORR LYMPH EXTRAV  INC GUIDE ROADMAPPING: IMG5450

## 2022-04-14 HISTORY — PX: IR US GUIDE VASC ACCESS LEFT: IMG2389

## 2022-04-14 LAB — CBC WITH DIFFERENTIAL/PLATELET
Abs Immature Granulocytes: 0.04 10*3/uL (ref 0.00–0.07)
Basophils Absolute: 0 10*3/uL (ref 0.0–0.1)
Basophils Relative: 0 %
Eosinophils Absolute: 0 10*3/uL (ref 0.0–0.5)
Eosinophils Relative: 1 %
HCT: 17.5 % — ABNORMAL LOW (ref 36.0–46.0)
Hemoglobin: 6.2 g/dL — CL (ref 12.0–15.0)
Immature Granulocytes: 1 %
Lymphocytes Relative: 13 %
Lymphs Abs: 0.8 10*3/uL (ref 0.7–4.0)
MCH: 32.6 pg (ref 26.0–34.0)
MCHC: 35.4 g/dL (ref 30.0–36.0)
MCV: 92.1 fL (ref 80.0–100.0)
Monocytes Absolute: 0.4 10*3/uL (ref 0.1–1.0)
Monocytes Relative: 6 %
Neutro Abs: 4.9 10*3/uL (ref 1.7–7.7)
Neutrophils Relative %: 79 %
Platelets: 113 10*3/uL — ABNORMAL LOW (ref 150–400)
RBC: 1.9 MIL/uL — ABNORMAL LOW (ref 3.87–5.11)
RDW: 12.8 % (ref 11.5–15.5)
WBC: 6.3 10*3/uL (ref 4.0–10.5)
nRBC: 0 % (ref 0.0–0.2)

## 2022-04-14 LAB — CBC
HCT: 37.7 % (ref 36.0–46.0)
Hemoglobin: 13.7 g/dL (ref 12.0–15.0)
MCH: 31.7 pg (ref 26.0–34.0)
MCHC: 36.3 g/dL — ABNORMAL HIGH (ref 30.0–36.0)
MCV: 87.3 fL (ref 80.0–100.0)
Platelets: 188 10*3/uL (ref 150–400)
RBC: 4.32 MIL/uL (ref 3.87–5.11)
RDW: 13 % (ref 11.5–15.5)
WBC: 13.1 10*3/uL — ABNORMAL HIGH (ref 4.0–10.5)
nRBC: 0 % (ref 0.0–0.2)

## 2022-04-14 LAB — BPAM PLATELET PHERESIS
Blood Product Expiration Date: 202402012359
Blood Product Expiration Date: 202402012359
ISSUE DATE / TIME: 202401291852
Unit Type and Rh: 5100
Unit Type and Rh: 7300

## 2022-04-14 LAB — COMPREHENSIVE METABOLIC PANEL
ALT: 9 U/L (ref 0–44)
AST: 12 U/L — ABNORMAL LOW (ref 15–41)
Albumin: <1.5 g/dL — ABNORMAL LOW (ref 3.5–5.0)
Alkaline Phosphatase: 45 U/L (ref 38–126)
Anion gap: 12 (ref 5–15)
BUN: 5 mg/dL — ABNORMAL LOW (ref 6–20)
CO2: 14 mmol/L — ABNORMAL LOW (ref 22–32)
Calcium: 6.6 mg/dL — ABNORMAL LOW (ref 8.9–10.3)
Chloride: 107 mmol/L (ref 98–111)
Creatinine, Ser: 0.37 mg/dL — ABNORMAL LOW (ref 0.44–1.00)
GFR, Estimated: >60 mL/min (ref >60)
Glucose, Bld: 51 mg/dL — ABNORMAL LOW (ref 70–99)
Potassium: 3.7 mmol/L (ref 3.5–5.1)
Sodium: 133 mmol/L — ABNORMAL LOW (ref 135–145)
Total Bilirubin: 0.5 mg/dL (ref 0.3–1.2)
Total Protein: <3 g/dL — ABNORMAL LOW (ref 6.5–8.1)

## 2022-04-14 LAB — POCT I-STAT EG7
Acid-base deficit: 5 mmol/L — ABNORMAL HIGH (ref 0.0–2.0)
Bicarbonate: 19.6 mmol/L — ABNORMAL LOW (ref 20.0–28.0)
Calcium, Ion: 1.13 mmol/L — ABNORMAL LOW (ref 1.15–1.40)
HCT: 25 % — ABNORMAL LOW (ref 36.0–46.0)
Hemoglobin: 8.5 g/dL — ABNORMAL LOW (ref 12.0–15.0)
O2 Saturation: 79 %
Potassium: 3.3 mmol/L — ABNORMAL LOW (ref 3.5–5.1)
Sodium: 138 mmol/L (ref 135–145)
TCO2: 21 mmol/L — ABNORMAL LOW (ref 22–32)
pCO2, Ven: 34.1 mmHg — ABNORMAL LOW (ref 44–60)
pH, Ven: 7.367 (ref 7.25–7.43)
pO2, Ven: 44 mmHg (ref 32–45)

## 2022-04-14 LAB — MASSIVE TRANSFUSION PROTOCOL ORDER (BLOOD BANK NOTIFICATION)

## 2022-04-14 LAB — BPAM FFP
Blood Product Expiration Date: 202401312359
Blood Product Expiration Date: 202401312359
Blood Product Expiration Date: 202401312359
ISSUE DATE / TIME: 202401291549
ISSUE DATE / TIME: 202401291549
ISSUE DATE / TIME: 202401291549
Unit Type and Rh: 7300
Unit Type and Rh: 7300
Unit Type and Rh: 7300

## 2022-04-14 LAB — PREPARE FRESH FROZEN PLASMA: Unit division: 0

## 2022-04-14 LAB — APTT: aPTT: 38 seconds — ABNORMAL HIGH (ref 24–36)

## 2022-04-14 LAB — PROTIME-INR
INR: 1.5 — ABNORMAL HIGH (ref 0.8–1.2)
Prothrombin Time: 17.5 seconds — ABNORMAL HIGH (ref 11.4–15.2)

## 2022-04-14 LAB — PREPARE RBC (CROSSMATCH)

## 2022-04-14 LAB — POCT I-STAT 7, (LYTES, BLD GAS, ICA,H+H)
Acid-base deficit: 7 mmol/L — ABNORMAL HIGH (ref 0.0–2.0)
Bicarbonate: 20.2 mmol/L (ref 20.0–28.0)
Calcium, Ion: 0.99 mmol/L — ABNORMAL LOW (ref 1.15–1.40)
HCT: 38 % (ref 36.0–46.0)
Hemoglobin: 12.9 g/dL (ref 12.0–15.0)
O2 Saturation: 96 %
Patient temperature: 36
Potassium: 3.8 mmol/L (ref 3.5–5.1)
Sodium: 138 mmol/L (ref 135–145)
TCO2: 22 mmol/L (ref 22–32)
pCO2 arterial: 44 mmHg (ref 32–48)
pH, Arterial: 7.266 — ABNORMAL LOW (ref 7.35–7.45)
pO2, Arterial: 93 mmHg (ref 83–108)

## 2022-04-14 LAB — DIC (DISSEMINATED INTRAVASCULAR COAGULATION)PANEL
D-Dimer, Quant: >20 ug/mL-FEU — ABNORMAL HIGH (ref 0.00–0.50)
Fibrinogen: 385 mg/dL (ref 210–475)
INR: 1.1 (ref 0.8–1.2)
Platelets: 179 10*3/uL (ref 150–400)
Prothrombin Time: 13.8 seconds (ref 11.4–15.2)
Smear Review: NONE SEEN
aPTT: 31 seconds (ref 24–36)

## 2022-04-14 LAB — FIBRINOGEN: Fibrinogen: 223 mg/dL (ref 210–475)

## 2022-04-14 LAB — PREPARE PLATELET PHERESIS
Unit division: 0
Unit division: 0

## 2022-04-14 LAB — GLUCOSE, CAPILLARY
Glucose-Capillary: 74 mg/dL (ref 70–99)
Glucose-Capillary: 76 mg/dL (ref 70–99)
Glucose-Capillary: 82 mg/dL (ref 70–99)

## 2022-04-14 LAB — HIV ANTIBODY (ROUTINE TESTING W REFLEX): HIV Screen 4th Generation wRfx: NONREACTIVE

## 2022-04-14 SURGERY — LAPAROTOMY, EXPLORATORY
Anesthesia: General

## 2022-04-14 SURGERY — Surgical Case
Anesthesia: Spinal

## 2022-04-14 MED ORDER — OXYTOCIN-SODIUM CHLORIDE 30-0.9 UT/500ML-% IV SOLN
INTRAVENOUS | Status: AC
  Filled 2022-04-14: qty 500

## 2022-04-14 MED ORDER — OXYTOCIN-SODIUM CHLORIDE 30-0.9 UT/500ML-% IV SOLN
INTRAVENOUS | Status: DC | PRN
  Administered 2022-04-14: 500 mL via INTRAVENOUS
  Administered 2022-04-14: 41.7 mL via INTRAVENOUS

## 2022-04-14 MED ORDER — LACTATED RINGERS IV SOLN: INTRAVENOUS | Status: DC | PRN

## 2022-04-14 MED ORDER — BUPIVACAINE IN DEXTROSE 0.75-8.25 % IT SOLN
INTRATHECAL | Status: DC | PRN
  Administered 2022-04-14: 1.4 mL via INTRATHECAL

## 2022-04-14 MED ORDER — POVIDONE-IODINE 10 % EX SWAB
2.0000 | Freq: Once | CUTANEOUS | Status: AC
  Administered 2022-04-14: 2 via TOPICAL

## 2022-04-14 MED ORDER — PHENYLEPHRINE HCL (PRESSORS) 10 MG/ML IV SOLN
INTRAVENOUS | Status: DC | PRN
  Administered 2022-04-14: 240 ug via INTRAVENOUS
  Administered 2022-04-14: 160 ug via INTRAVENOUS
  Administered 2022-04-14: 240 ug via INTRAVENOUS

## 2022-04-14 MED ORDER — ACETAMINOPHEN 500 MG PO TABS
1000.0000 mg | ORAL_TABLET | Freq: Four times a day (QID) | ORAL | Status: DC
  Administered 2022-04-15 – 2022-04-17 (×9): 1000 mg via ORAL
  Filled 2022-04-14 (×9): qty 2

## 2022-04-14 MED ORDER — ALBUMIN HUMAN 5 % IV SOLN: INTRAVENOUS | Status: DC | PRN

## 2022-04-14 MED ORDER — DIPHENHYDRAMINE HCL 50 MG/ML IJ SOLN: 12.5000 mg | INTRAMUSCULAR | Status: DC | PRN

## 2022-04-14 MED ORDER — FENTANYL CITRATE (PF) 250 MCG/5ML IJ SOLN
INTRAMUSCULAR | Status: AC
  Filled 2022-04-14: qty 5

## 2022-04-14 MED ORDER — SODIUM CHLORIDE 0.9 % IR SOLN
Status: DC | PRN
  Administered 2022-04-14: 1000 mL

## 2022-04-14 MED ORDER — ACETAMINOPHEN 10 MG/ML IV SOLN
INTRAVENOUS | Status: AC
  Filled 2022-04-14: qty 100

## 2022-04-14 MED ORDER — CEFAZOLIN SODIUM-DEXTROSE 2-3 GM-%(50ML) IV SOLR
INTRAVENOUS | Status: DC | PRN
  Administered 2022-04-14: 2 g via INTRAVENOUS

## 2022-04-14 MED ORDER — MIDAZOLAM HCL 2 MG/2ML IJ SOLN
INTRAMUSCULAR | Status: AC
  Filled 2022-04-14: qty 2

## 2022-04-14 MED ORDER — KETOROLAC TROMETHAMINE 30 MG/ML IJ SOLN: 30.0000 mg | Freq: Once | INTRAMUSCULAR | Status: DC | PRN

## 2022-04-14 MED ORDER — PHENYLEPHRINE 80 MCG/ML (10ML) SYRINGE FOR IV PUSH (FOR BLOOD PRESSURE SUPPORT)
PREFILLED_SYRINGE | INTRAVENOUS | Status: AC
  Filled 2022-04-14: qty 10

## 2022-04-14 MED ORDER — ONDANSETRON HCL 4 MG/2ML IJ SOLN
INTRAMUSCULAR | Status: AC
  Filled 2022-04-14: qty 2

## 2022-04-14 MED ORDER — MORPHINE SULFATE (PF) 0.5 MG/ML IJ SOLN
INTRAMUSCULAR | Status: AC
  Filled 2022-04-14: qty 10

## 2022-04-14 MED ORDER — NALOXONE HCL 0.4 MG/ML IJ SOLN: 0.4000 mg | INTRAMUSCULAR | Status: DC | PRN

## 2022-04-14 MED ORDER — IBUPROFEN 600 MG PO TABS
600.0000 mg | ORAL_TABLET | Freq: Four times a day (QID) | ORAL | Status: DC
  Administered 2022-04-15 – 2022-04-17 (×10): 600 mg via ORAL
  Filled 2022-04-14 (×10): qty 1

## 2022-04-14 MED ORDER — FENTANYL CITRATE (PF) 100 MCG/2ML IJ SOLN
INTRAMUSCULAR | Status: DC | PRN
  Administered 2022-04-14: 15 ug via INTRATHECAL

## 2022-04-14 MED ORDER — DIPHENHYDRAMINE HCL 25 MG PO CAPS: 25.0000 mg | ORAL_CAPSULE | Freq: Four times a day (QID) | ORAL | Status: DC | PRN

## 2022-04-14 MED ORDER — ACETAMINOPHEN 500 MG PO TABS: 1000.0000 mg | ORAL_TABLET | Freq: Four times a day (QID) | ORAL | Status: DC

## 2022-04-14 MED ORDER — OXYTOCIN-SODIUM CHLORIDE 30-0.9 UT/500ML-% IV SOLN
2.5000 [IU]/h | INTRAVENOUS | Status: AC
  Administered 2022-04-14: 2.5 [IU]/h via INTRAVENOUS

## 2022-04-14 MED ORDER — PHENYLEPHRINE HCL-NACL 20-0.9 MG/250ML-% IV SOLN
INTRAVENOUS | Status: DC | PRN
  Administered 2022-04-14: 60 ug/min via INTRAVENOUS

## 2022-04-14 MED ORDER — CALCIUM CHLORIDE 10 % IV SOLN
INTRAVENOUS | Status: AC
  Filled 2022-04-14: qty 20

## 2022-04-14 MED ORDER — ACETAMINOPHEN 500 MG PO TABS: 1000.0000 mg | ORAL_TABLET | ORAL | Status: DC

## 2022-04-14 MED ORDER — MIDAZOLAM HCL 2 MG/2ML IJ SOLN
INTRAMUSCULAR | Status: DC | PRN
  Administered 2022-04-14: 2 mg via INTRAMUSCULAR

## 2022-04-14 MED ORDER — SENNOSIDES-DOCUSATE SODIUM 8.6-50 MG PO TABS
2.0000 | ORAL_TABLET | Freq: Two times a day (BID) | ORAL | Status: DC
  Administered 2022-04-15 – 2022-04-17 (×5): 2 via ORAL
  Filled 2022-04-14 (×5): qty 2

## 2022-04-14 MED ORDER — LACTATED RINGERS IV SOLN: INTRAVENOUS | Status: DC

## 2022-04-14 MED ORDER — GELATIN ABSORBABLE 12-7 MM EX MISC
CUTANEOUS | Status: AC
  Filled 2022-04-14: qty 1

## 2022-04-14 MED ORDER — GELATIN ABSORBABLE 12-7 MM EX MISC
CUTANEOUS | Status: AC
  Filled 2022-04-14: qty 2

## 2022-04-14 MED ORDER — SIMETHICONE 80 MG PO CHEW
80.0000 mg | CHEWABLE_TABLET | Freq: Three times a day (TID) | ORAL | Status: DC
  Administered 2022-04-15 – 2022-04-17 (×7): 80 mg via ORAL
  Filled 2022-04-14 (×7): qty 1

## 2022-04-14 MED ORDER — SCOPOLAMINE 1 MG/3DAYS TD PT72: 1.0000 | MEDICATED_PATCH | Freq: Once | TRANSDERMAL | Status: DC

## 2022-04-14 MED ORDER — METHYLERGONOVINE MALEATE 0.2 MG/ML IJ SOLN
INTRAMUSCULAR | Status: DC | PRN
  Administered 2022-04-14: .2 mg via INTRAMUSCULAR

## 2022-04-14 MED ORDER — ETOMIDATE 2 MG/ML IV SOLN
INTRAVENOUS | Status: DC | PRN
  Administered 2022-04-14: 20 mg via INTRAVENOUS

## 2022-04-14 MED ORDER — MENTHOL 3 MG MT LOZG
1.0000 | LOZENGE | OROMUCOSAL | Status: DC | PRN
  Filled 2022-04-14: qty 9

## 2022-04-14 MED ORDER — DIBUCAINE (PERIANAL) 1 % EX OINT: 1.0000 | TOPICAL_OINTMENT | CUTANEOUS | Status: DC | PRN

## 2022-04-14 MED ORDER — ONDANSETRON HCL 4 MG/2ML IJ SOLN: 4.0000 mg | Freq: Three times a day (TID) | INTRAMUSCULAR | Status: DC | PRN

## 2022-04-14 MED ORDER — SODIUM CHLORIDE 0.9% FLUSH: 3.0000 mL | INTRAVENOUS | Status: DC | PRN

## 2022-04-14 MED ORDER — KETOROLAC TROMETHAMINE 30 MG/ML IJ SOLN: 30.0000 mg | Freq: Four times a day (QID) | INTRAMUSCULAR | Status: DC | PRN

## 2022-04-14 MED ORDER — ONDANSETRON HCL 4 MG/2ML IJ SOLN
INTRAMUSCULAR | Status: DC | PRN
  Administered 2022-04-14: 4 mg via INTRAVENOUS

## 2022-04-14 MED ORDER — SOD CITRATE-CITRIC ACID 500-334 MG/5ML PO SOLN: 30.0000 mL | ORAL | Status: DC

## 2022-04-14 MED ORDER — SIMETHICONE 80 MG PO CHEW: 80.0000 mg | CHEWABLE_TABLET | ORAL | Status: DC | PRN

## 2022-04-14 MED ORDER — SODIUM CHLORIDE 0.9% IV SOLUTION: Freq: Once | INTRAVENOUS | Status: DC

## 2022-04-14 MED ORDER — PROMETHAZINE HCL 25 MG/ML IJ SOLN: 6.2500 mg | INTRAMUSCULAR | Status: DC | PRN

## 2022-04-14 MED ORDER — CEFAZOLIN SODIUM-DEXTROSE 2-4 GM/100ML-% IV SOLN
2.0000 g | INTRAVENOUS | Status: AC
  Administered 2022-04-14: 2 g via INTRAVENOUS

## 2022-04-14 MED ORDER — PHENYLEPHRINE HCL-NACL 20-0.9 MG/250ML-% IV SOLN
INTRAVENOUS | Status: AC
  Filled 2022-04-14: qty 250

## 2022-04-14 MED ORDER — MIDAZOLAM HCL 2 MG/2ML IJ SOLN
INTRAMUSCULAR | Status: DC | PRN
  Administered 2022-04-14: 2 mg via INTRAVENOUS

## 2022-04-14 MED ORDER — IOHEXOL 300 MG/ML  SOLN
150.0000 mL | Freq: Once | INTRAMUSCULAR | Status: AC | PRN
  Administered 2022-04-14: 100 mL via INTRA_ARTERIAL

## 2022-04-14 MED ORDER — ROCURONIUM BROMIDE 100 MG/10ML IV SOLN
INTRAVENOUS | Status: DC | PRN
  Administered 2022-04-14: 20 mg via INTRAVENOUS

## 2022-04-14 MED ORDER — FUROSEMIDE 10 MG/ML IJ SOLN: 20.0000 mg | Freq: Once | INTRAMUSCULAR | Status: DC

## 2022-04-14 MED ORDER — SUGAMMADEX SODIUM 200 MG/2ML IV SOLN
INTRAVENOUS | Status: DC | PRN
  Administered 2022-04-14: 200 mg via INTRAVENOUS

## 2022-04-14 MED ORDER — LIDOCAINE HCL 1 % IJ SOLN
INTRAMUSCULAR | Status: AC
  Filled 2022-04-14: qty 20

## 2022-04-14 MED ORDER — ACETAMINOPHEN 10 MG/ML IV SOLN
INTRAVENOUS | Status: DC | PRN
  Administered 2022-04-14: 1000 mg via INTRAVENOUS

## 2022-04-14 MED ORDER — DIPHENHYDRAMINE HCL 25 MG PO CAPS: 25.0000 mg | ORAL_CAPSULE | Freq: Once | ORAL | Status: DC

## 2022-04-14 MED ORDER — SOD CITRATE-CITRIC ACID 500-334 MG/5ML PO SOLN
ORAL | Status: AC
  Filled 2022-04-14: qty 30

## 2022-04-14 MED ORDER — KETOROLAC TROMETHAMINE 30 MG/ML IJ SOLN
30.0000 mg | Freq: Four times a day (QID) | INTRAMUSCULAR | Status: DC | PRN
  Administered 2022-04-14 – 2022-04-15 (×2): 30 mg via INTRAVENOUS
  Filled 2022-04-14 (×2): qty 1

## 2022-04-14 MED ORDER — DIPHENHYDRAMINE HCL 25 MG PO CAPS: 25.0000 mg | ORAL_CAPSULE | ORAL | Status: DC | PRN

## 2022-04-14 MED ORDER — PHENYLEPHRINE HCL (PRESSORS) 10 MG/ML IV SOLN
INTRAVENOUS | Status: DC | PRN
  Administered 2022-04-14: 80 ug via INTRAVENOUS
  Administered 2022-04-14: 160 ug via INTRAVENOUS
  Administered 2022-04-14 (×4): 80 ug via INTRAVENOUS

## 2022-04-14 MED ORDER — ZOLPIDEM TARTRATE 5 MG PO TABS: 5.0000 mg | ORAL_TABLET | Freq: Every evening | ORAL | Status: DC | PRN

## 2022-04-14 MED ORDER — SODIUM BICARBONATE 8.4 % IV SOLN
INTRAVENOUS | Status: AC
  Filled 2022-04-14: qty 50

## 2022-04-14 MED ORDER — MEPERIDINE HCL 25 MG/ML IJ SOLN: 6.2500 mg | INTRAMUSCULAR | Status: DC | PRN

## 2022-04-14 MED ORDER — OXYCODONE HCL 5 MG PO TABS
5.0000 mg | ORAL_TABLET | Freq: Four times a day (QID) | ORAL | Status: DC | PRN
  Filled 2022-04-14: qty 1

## 2022-04-14 MED ORDER — NALOXONE HCL 4 MG/10ML IJ SOLN: 1.0000 ug/kg/h | INTRAVENOUS | Status: DC | PRN

## 2022-04-14 MED ORDER — COCONUT OIL OIL: 1.0000 | TOPICAL_OIL | Status: DC | PRN

## 2022-04-14 MED ORDER — KETAMINE HCL 10 MG/ML IJ SOLN
INTRAMUSCULAR | Status: DC | PRN
  Administered 2022-04-14: 50 mg via INTRAVENOUS

## 2022-04-14 MED ORDER — SUCCINYLCHOLINE CHLORIDE 200 MG/10ML IV SOSY
PREFILLED_SYRINGE | INTRAVENOUS | Status: DC | PRN
  Administered 2022-04-14: 160 mg via INTRAVENOUS

## 2022-04-14 MED ORDER — KETAMINE HCL 50 MG/5ML IJ SOSY
PREFILLED_SYRINGE | INTRAMUSCULAR | Status: AC
  Filled 2022-04-14: qty 5

## 2022-04-14 MED ORDER — FENTANYL CITRATE (PF) 100 MCG/2ML IJ SOLN
INTRAMUSCULAR | Status: DC | PRN
  Administered 2022-04-14: 250 ug via INTRAVENOUS

## 2022-04-14 MED ORDER — PROPOFOL 10 MG/ML IV BOLUS
INTRAVENOUS | Status: DC | PRN
  Administered 2022-04-14: 100 mg via INTRAVENOUS

## 2022-04-14 MED ORDER — STERILE WATER FOR IRRIGATION IR SOLN
Status: DC | PRN
  Administered 2022-04-14: 1000 mL

## 2022-04-14 MED ORDER — PRENATAL MULTIVITAMIN CH
1.0000 | ORAL_TABLET | Freq: Every day | ORAL | Status: DC
  Administered 2022-04-15 – 2022-04-17 (×3): 1 via ORAL
  Filled 2022-04-14 (×3): qty 1

## 2022-04-14 MED ORDER — SODIUM BICARBONATE 8.4 % IV SOLN
INTRAVENOUS | Status: DC | PRN
  Administered 2022-04-14 (×2): 50 meq via INTRAVENOUS

## 2022-04-14 MED ORDER — WITCH HAZEL-GLYCERIN EX PADS: 1.0000 | MEDICATED_PAD | CUTANEOUS | Status: DC | PRN

## 2022-04-14 MED ORDER — OXYTOCIN-SODIUM CHLORIDE 30-0.9 UT/500ML-% IV SOLN: INTRAVENOUS | Status: DC | PRN

## 2022-04-14 MED ORDER — HYDROMORPHONE HCL 1 MG/ML IJ SOLN: 0.2500 mg | INTRAMUSCULAR | Status: DC | PRN

## 2022-04-14 MED ORDER — OXYCODONE HCL 5 MG PO TABS
5.0000 mg | ORAL_TABLET | ORAL | Status: DC | PRN
  Administered 2022-04-16 – 2022-04-17 (×2): 5 mg via ORAL
  Filled 2022-04-14: qty 2

## 2022-04-14 MED ORDER — FENTANYL CITRATE (PF) 100 MCG/2ML IJ SOLN
INTRAMUSCULAR | Status: AC
  Filled 2022-04-14: qty 2

## 2022-04-14 MED ORDER — MORPHINE SULFATE (PF) 0.5 MG/ML IJ SOLN
INTRAMUSCULAR | Status: DC | PRN
  Administered 2022-04-14: .15 mg via INTRATHECAL

## 2022-04-14 MED ORDER — OXYTOCIN-SODIUM CHLORIDE 30-0.9 UT/500ML-% IV SOLN
INTRAVENOUS | Status: DC | PRN
  Administered 2022-04-14: 300 mL via INTRAVENOUS

## 2022-04-14 MED ORDER — CEFAZOLIN SODIUM-DEXTROSE 2-4 GM/100ML-% IV SOLN
INTRAVENOUS | Status: AC
  Filled 2022-04-14: qty 100

## 2022-04-14 SURGICAL SUPPLY — 37 items
BENZOIN TINCTURE PRP APPL 2/3 (GAUZE/BANDAGES/DRESSINGS) IMPLANT
CHLORAPREP W/TINT 26 (MISCELLANEOUS) ×2 IMPLANT
CLAMP UMBILICAL CORD (MISCELLANEOUS) ×1 IMPLANT
CLOTH BEACON ORANGE TIMEOUT ST (SAFETY) ×1 IMPLANT
DRAPE C SECTION CLR SCREEN (DRAPES) ×1 IMPLANT
DRSG OPSITE POSTOP 4X10 (GAUZE/BANDAGES/DRESSINGS) ×1 IMPLANT
ELECT REM PT RETURN 9FT ADLT (ELECTROSURGICAL) ×1
ELECTRODE REM PT RTRN 9FT ADLT (ELECTROSURGICAL) ×1 IMPLANT
EXTRACTOR VACUUM KIWI (MISCELLANEOUS) IMPLANT
GAUZE SPONGE 4X4 12PLY STRL LF (GAUZE/BANDAGES/DRESSINGS) IMPLANT
GLOVE BIO SURGEON STRL SZ 6.5 (GLOVE) ×1 IMPLANT
GLOVE BIOGEL PI IND STRL 7.0 (GLOVE) ×2 IMPLANT
GOWN STRL REUS W/TWL LRG LVL3 (GOWN DISPOSABLE) ×2 IMPLANT
KIT ABG SYR 3ML LUER SLIP (SYRINGE) IMPLANT
NDL HYPO 25X5/8 SAFETYGLIDE (NEEDLE) IMPLANT
NEEDLE HYPO 25X5/8 SAFETYGLIDE (NEEDLE) IMPLANT
NS IRRIG 1000ML POUR BTL (IV SOLUTION) ×1 IMPLANT
PACK C SECTION WH (CUSTOM PROCEDURE TRAY) ×1 IMPLANT
PAD ABD 7.5X8 STRL (GAUZE/BANDAGES/DRESSINGS) IMPLANT
PAD OB MATERNITY 4.3X12.25 (PERSONAL CARE ITEMS) ×1 IMPLANT
RETRACTOR WND ALEXIS 25 LRG (MISCELLANEOUS) ×1 IMPLANT
RTRCTR C-SECT PINK 25CM LRG (MISCELLANEOUS) IMPLANT
RTRCTR WOUND ALEXIS 25CM LRG (MISCELLANEOUS) ×1
STRIP CLOSURE SKIN 1/2X4 (GAUZE/BANDAGES/DRESSINGS) IMPLANT
SUT CHROMIC 1 CTX 36 (SUTURE) ×2 IMPLANT
SUT PLAIN 0 NONE (SUTURE) IMPLANT
SUT PLAIN 2 0 XLH (SUTURE) ×1 IMPLANT
SUT VIC AB 0 CT1 27 (SUTURE) ×2
SUT VIC AB 0 CT1 27XBRD ANBCTR (SUTURE) ×2 IMPLANT
SUT VIC AB 2-0 CT1 27 (SUTURE) ×1
SUT VIC AB 2-0 CT1 TAPERPNT 27 (SUTURE) ×1 IMPLANT
SUT VIC AB 3-0 CT1 27 (SUTURE)
SUT VIC AB 3-0 CT1 TAPERPNT 27 (SUTURE) IMPLANT
SUT VIC AB 4-0 KS 27 (SUTURE) ×1 IMPLANT
TOWEL OR 17X24 6PK STRL BLUE (TOWEL DISPOSABLE) ×1 IMPLANT
TRAY FOLEY W/BAG SLVR 14FR LF (SET/KITS/TRAYS/PACK) ×1 IMPLANT
WATER STERILE IRR 1000ML POUR (IV SOLUTION) ×1 IMPLANT

## 2022-04-14 SURGICAL SUPPLY — 27 items
BENZOIN TINCTURE PRP APPL 2/3 (GAUZE/BANDAGES/DRESSINGS) IMPLANT
CHLORAPREP W/TINT 26 (MISCELLANEOUS) IMPLANT
DRSG OPSITE POSTOP 4X10 (GAUZE/BANDAGES/DRESSINGS) IMPLANT
ELECT REM PT RETURN 9FT ADLT (ELECTROSURGICAL) ×1
ELECTRODE REM PT RTRN 9FT ADLT (ELECTROSURGICAL) IMPLANT
GAUZE SPONGE 4X4 12PLY STRL LF (GAUZE/BANDAGES/DRESSINGS) IMPLANT
GLOVE BIOGEL PI IND STRL 7.0 (GLOVE) IMPLANT
GLOVE ECLIPSE 7.0 STRL STRAW (GLOVE) IMPLANT
HEMOSTAT ARISTA ABSORB 3G PWDR (HEMOSTASIS) IMPLANT
MAT PREVALON FULL STRYKER (MISCELLANEOUS) IMPLANT
NS IRRIG 1000ML POUR BTL (IV SOLUTION) IMPLANT
PACK C SECTION WH (CUSTOM PROCEDURE TRAY) IMPLANT
PAD ABD 7.5X8 STRL (GAUZE/BANDAGES/DRESSINGS) IMPLANT
PAD OB MATERNITY 4.3X12.25 (PERSONAL CARE ITEMS) IMPLANT
RTRCTR C-SECT PINK 25CM LRG (MISCELLANEOUS) IMPLANT
STRIP CLOSURE SKIN 1/2X4 (GAUZE/BANDAGES/DRESSINGS) IMPLANT
SUT MNCRL 0 VIOLET CTX 36 (SUTURE) IMPLANT
SUT MONOCRYL 0 CTX 36 (SUTURE) ×1
SUT PDS AB 0 CTX 36 PDP370T (SUTURE) IMPLANT
SUT PLAIN 2 0 XLH (SUTURE) IMPLANT
SUT VIC AB 0 CTX 36 (SUTURE) ×1
SUT VIC AB 0 CTX36XBRD ANBCTRL (SUTURE) IMPLANT
SUT VIC AB 2-0 CT1 27 (SUTURE) ×1
SUT VIC AB 2-0 CT1 TAPERPNT 27 (SUTURE) IMPLANT
SUT VIC AB 4-0 KS 27 (SUTURE) IMPLANT
TOWEL OR 17X24 6PK STRL BLUE (TOWEL DISPOSABLE) IMPLANT
WATER STERILE IRR 1000ML POUR (IV SOLUTION) IMPLANT

## 2022-04-14 NOTE — Anesthesia Procedure Notes (Addendum)
Procedure Name: Intubation Date/Time: 04/14/2022 3:59 PM  Performed by: Elenore Paddy, CRNAPre-anesthesia Checklist: Patient identified, Emergency Drugs available, Suction available, Patient being monitored and Timeout performed Patient Re-evaluated:Patient Re-evaluated prior to induction Oxygen Delivery Method: Circle system utilized Preoxygenation: Pre-oxygenation with 100% oxygen Induction Type: IV induction and Cricoid Pressure applied Ventilation: Mask ventilation without difficulty Laryngoscope Size: 3 and Glidescope Grade View: Grade I Tube size: 7.0 mm Number of attempts: 1 Airway Equipment and Method: Stylet Placement Confirmation: ETT inserted through vocal cords under direct vision, positive ETCO2, CO2 detector and breath sounds checked- equal and bilateral Secured at: 21 cm Dental Injury: Teeth and Oropharynx as per pre-operative assessment

## 2022-04-14 NOTE — Transfer of Care (Signed)
Immediate Anesthesia Transfer of Care Note  Patient: Megan Fields  Procedure(s) Performed: CESAREAN SECTION  Patient Location: PACU  Anesthesia Type:Spinal  Level of Consciousness: awake, alert , and oriented  Airway & Oxygen Therapy: Patient Spontanous Breathing  Post-op Assessment: Report given to RN and Post -op Vital signs reviewed and stable  Post vital signs: Reviewed and stable  Last Vitals:  Vitals Value Taken Time  BP 103/71 04/14/22 1503  Temp 36.4 C 04/14/22 1448  Pulse 110 04/14/22 1506  Resp 24 04/14/22 1506  SpO2 100 % 04/14/22 1506  Vitals shown include unvalidated device data.  Last Pain:  Vitals:   04/14/22 1454  TempSrc:   PainSc: 0-No pain         Complications: No notable events documented.

## 2022-04-14 NOTE — Op Note (Addendum)
Operative Note    Preoperative Diagnosis Postpartum hemorrhage  Unstable vital signs S/P repeat cesarean section x 2 Chtn GDMA2 History of asthma   Postoperative Diagnosis: Same    Procedure: Exploratory laparotomy   Surgeon: Mickle Mallory, DO  Assist: Kathlen Mody DO     An experienced assistant was required given the standard of surgical care given the complexity of the case and maternal body habitus.  This assistant was needed for exposure, dissection, suctioning, retraction, instrument exchange, and for overall help during the procedure.    Anesthesia: General (Dr Jillyn Hidden)   Fluids: LR 555ml with phenylephrine EBL: 64ml UOP: clear, copious Blood - s/p bag of prbcs hung; on 1 bag platelets  Findings: Moderately boggy uterus, grossly normal ovaries and fallopian tubes, No obvious hematoma, no clots, no active bleeding, intact bladder.    Specimen: none   Procedure Note  Consent verified pre-op. All questions answered  2gms ancef given in OR   Patient was taken to the operating room where general anesthesia was administered. She was prepped and draped in the normal sterile fashion and placed in the dorsal supine position. An appropriate time out was performed.   The recently made pfannenstiel skin incision was then reopened as was the subcutaneous fat and fascia. No active bleeding was noted though a small site in the fat was cauterized for oozing.  Next the rectus muscles were inspected and again no active bleeding noted. The peritoneum was opened and about a 4ml amount of blood was suctioned. The bladder flap was noted to have some mild oozing but otherwise no active bleeding or clots were observed.  The alexis retractor was placed and a more extensive exam performed. There was no bleeding noted of the omentum. The uterus was exteriorized. There was no bleeding or lacerations noted posteriorly or laterally. The ovaries and fallopian tubes were both grossly normal. There was  not bulging along the lateral, posterior or anterior abdominal walls either.  The uterus was massaged without any active bleeding noted seeping through the incision or vaginally.  A dose of methergine was given and 10109mcg of cytotec was placed rectally. A decision was made at this time to not to perform a B-Lynch stitch or an O'Leary stitch to avoid eliciting any further bleeding from incision sites and to rather proceed with getting patient to IR for a uterine artery embolization.  Thus, all instruments were removed from the patients abdomen.  The fascia was then closed with 0 Vicryl in a running fashion and the skin was closed with a subcuticular stitch of 4-0 Vicryl on a Keith needle and then reinforced with benzoin and Steri-Strips and a pressure dressing.  At the conclusion of the procedure all instruments and sponge counts were correct. Patient was taken to IR while still intubated but in stable condition with blood products running.

## 2022-04-14 NOTE — Procedures (Addendum)
Interventional Radiology Procedure Note  Emergent consent for postpartum hemorrhage requiring massive transfusion protocol obtained with Dr Terri Piedra.  Husband aware and agrees.  Procedure: bilateral internal iliac gelfoam embo for [place]  (post partum hemorrhage)  Complications: None  Estimated Blood Loss:  min  Findings: Full report in pacs     Tamera Punt, MD

## 2022-04-14 NOTE — Transfer of Care (Signed)
Immediate Anesthesia Transfer of Care Note  Patient: Megan Fields  Procedure(s) Performed: EXPLORATORY LAPAROTOMY  Patient Location: PACU  Anesthesia Type:General  Level of Consciousness: awake, alert , and oriented  Airway & Oxygen Therapy: Patient Spontanous Breathing and Patient connected to nasal cannula oxygen  Post-op Assessment: Report given to RN and Post -op Vital signs reviewed and stable  Post vital signs: Reviewed and stable  Last Vitals:  Vitals Value Taken Time  BP 93/72 04/14/22 1857  Temp 37 C 04/14/22 1843  Pulse 135 04/14/22 1858  Resp 23 04/14/22 1858  SpO2 95 % 04/14/22 1858  Vitals shown include unvalidated device data.  Last Pain:  Vitals:   04/14/22 1855  TempSrc:   PainSc: 0-No pain         Complications: No notable events documented.

## 2022-04-14 NOTE — Anesthesia Preprocedure Evaluation (Signed)
Anesthesia Evaluation  Patient identified by MRN, date of birth, ID band Patient awake    Reviewed: Allergy & Precautions, H&P , NPO status , Patient's Chart, lab work & pertinent test results  Airway Mallampati: I       Dental no notable dental hx.    Pulmonary asthma    Pulmonary exam normal        Cardiovascular hypertension, Pt. on medications Normal cardiovascular exam     Neuro/Psych  PSYCHIATRIC DISORDERS Anxiety Depression       GI/Hepatic negative GI ROS, Neg liver ROS,,,  Endo/Other  diabetes, Gestational, Oral Hypoglycemic Agents    Renal/GU negative Renal ROS  negative genitourinary   Musculoskeletal negative musculoskeletal ROS (+)    Abdominal Normal abdominal exam  (+)   Peds  Hematology negative hematology ROS (+)   Anesthesia Other Findings   Reproductive/Obstetrics (+) Pregnancy                             Anesthesia Physical Anesthesia Plan  ASA: II  Anesthesia Plan: Spinal   Post-op Pain Management:    Induction: Intravenous  PONV Risk Score and Plan: 3 and Treatment may vary due to age or medical condition and Ondansetron  Airway Management Planned: Simple Face Mask, Natural Airway and Nasal Cannula  Additional Equipment: None  Intra-op Plan:   Post-operative Plan:   Informed Consent: I have reviewed the patients History and Physical, chart, labs and discussed the procedure including the risks, benefits and alternatives for the proposed anesthesia with the patient or authorized representative who has indicated his/her understanding and acceptance.       Plan Discussed with: CRNA  Anesthesia Plan Comments:         Anesthesia Quick Evaluation

## 2022-04-14 NOTE — Progress Notes (Signed)
Patient ID: Megan Fields, female   DOB: 1980-10-24, 42 y.o.   MRN: [id number] Late entry note due to ongoing active patient care  In PACU, patients' BP continued to fluctuate with SBR ranging from 00F-749 and diastolic in 44-96P. Pt was given albumen with no significant improvement noted. HR increased to the 120s. Pt remained awake, alert and conversant with no complaints other than "1 feel cold" and noted shivering.  Fundus remained firm, bleeding/lochia mild.  Istat done noted Hg of 8.5 ( start Hg was 12.6) despite a 654ml EBL.   Decision was made to transfuse 2u prbcs after a stat cbc, cmp and coagulation panel was drawn.   When results of cbc returned, Hg was noted to have dropped further to 6.2; platelets to 113 (from 233)  At this time, I had a conversation with patient and husband. I explained that given drop in hemoglobin and unstable vitals, there was likely internal bleeding and in that case it was imperative we address it. I recommended returning to the OR for an exploratory laparotomy and repair of any active bleeding. I explained that the drop in platelets was concerning for further bleeding. I also advised that there was a risk of needing to perform an emergency hysterectomy if the bleeding could not be effectively controlled otherwise. I briefly mentioned the option of IR to block the vessels but at this time, I thought going to the OR was the better option.  Pt verbalized and signed consent  I also had a discussion with Dr Jillyn Hidden (anesthesia) and we agreed on starting a massive transfusion protocol  Drs Nelda Marseille and Dione Plover were present and offered assistance in the OR which I accepted.   Staff was notified and pt prepped for OR

## 2022-04-14 NOTE — Interval H&P Note (Signed)
History and Physical Interval Note: No change since H/P done Pt confirms consent to repeat cesarean section BP stable  Doppler - 150 Contractions - none reported  Consent confirmed  To OR when ready   04/14/2022 12:46 PM  Megan Fields  has presented today for surgery, with the diagnosis of repeat c-section  OKAY PER LISA.  The various methods of treatment have been discussed with the patient and family. After consideration of risks, benefits and other options for treatment, the patient has consented to  Procedure(s) with comments: CESAREAN SECTION (N/A) - request fellow to assist as a surgical intervention.  The patient's history has been reviewed, patient examined, no change in status, stable for surgery.  I have reviewed the patient's chart and labs.  Questions were answered to the patient's satisfaction.     Isaiah Serge

## 2022-04-14 NOTE — H&P (Signed)
Megan Fields is a 30 [id number] female presenting at 390/[redacted]wks gestation for elective repeat cesarean section. She is dated per LMP which was confirmed by a 9week [place]. 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  OB History     Gravida  5   Para  2   Term  2   Preterm      AB  2   Living  2      SAB  2   IAB      Ectopic      Multiple  0   Live Births  2          Past Medical History:  Diagnosis Date   Anxiety    Asthma    Depression    Gestational diabetes    Hearing loss in right ear    Hypertension    Past Surgical History:  Procedure Laterality Date   CESAREAN SECTION N/A 09/29/2017   Procedure: CESAREAN SECTION;  Surgeon: Sherlyn Hay, DO;  Location: [facility];  Service: Obstetrics;  Laterality: N/A;  Heather  RNFA   CYST REMOVAL TRUNK Right 12/1996   Boil/cyst removed right at side at waist line   DILATION AND CURETTAGE OF UTERUS     DILATION AND EVACUATION N/A 05/21/2016   Procedure: DILATATION AND EVACUATION WITH ULTRASOUND GUIDANCE;  Surgeon: Sherlyn Hay, DO;  Location: [facility];  Service: Gynecology;  Laterality: N/A;   Family History: family history includes Anxiety disorder in her father; Breast cancer (age of onset: 76) in her maternal aunt; Depression in her father; Diabetes type II in her mother; Hypertension in her mother. Social History:  reports that she has never smoked. She has never used smokeless tobacco. She reports current alcohol use. She reports that she does not use drugs.     Maternal Diabetes: Yes:  Diabetes Type:  Insulin/Medication controlled Genetic Screening: Normal Maternal Ultrasounds/Referrals: Normal Fetal Ultrasounds or other Referrals:   None Maternal Substance Abuse:  No Significant Maternal Medications:  Meds include: Other: see HPI Significant Maternal Lab Results:  Group B Strep negative Number of Prenatal Visits:greater than 3 verified prenatal visits Other Comments:  None  Review of Systems  Constitutional:  Positive for activity change and fatigue.  Eyes:  Negative for photophobia and visual disturbance.  Respiratory:  Negative for chest tightness and shortness of breath.   Cardiovascular:  Positive for leg swelling. Negative for chest pain and palpitations.  Gastrointestinal:  Negative for abdominal pain.  Genitourinary:  Positive for pelvic pain.  Musculoskeletal:  Positive for back pain.  Neurological:  Negative for light-headedness and headaches.  Psychiatric/Behavioral:  Positive for sleep disturbance. The patient is nervous/anxious.    Maternal Medical History:  Reason for admission: Scheduled repeat cesarean section   Contractions: Frequency: rare.   Perceived severity is mild.   Fetal activity: Perceived fetal activity is normal.   Prenatal complications: PIH.   Prenatal Complications - Diabetes: gestational. Diabetes is managed by oral agent (monotherapy).       There were no vitals taken for this visit. Maternal Exam:  Uterine Assessment: Contraction frequency is rare.  Abdomen: Patient reports generalized tenderness.  Estimated fetal weight is  AGA.   Fetal presentation: vertex Introitus: Normal vulva. Vulva is negative for condylomata and lesion.  Normal vagina.  Vagina is negative for condylomata.    Physical Exam Vitals and nursing note reviewed. Exam conducted with a chaperone present.  Constitutional:      Appearance: Normal appearance. She is normal weight.  Pulmonary:     Effort: Pulmonary effort is normal.  Abdominal:     Tenderness: There is generalized abdominal tenderness.  Genitourinary:    General: Normal vulva.  Vulva is no lesion.  Musculoskeletal:        General:  Normal range of motion.     Cervical back: Normal range of motion.  Skin:    General: Skin is warm and dry.     Capillary Refill: Capillary refill takes 2 to 3 seconds.  Neurological:     General: No focal deficit present.     Mental Status: She is alert and oriented to person, place, and time. Mental status is at baseline.  Psychiatric:        Mood and Affect: Mood normal.        Behavior: Behavior normal.        Thought Content: Thought content normal.        Judgment: Judgment normal.     Prenatal labs: ABO, Rh: --/--/B POS (01/26 1155) Antibody: NEG (01/26 1155) Rubella: Immune (07/03 0000) RPR: NON REACTIVE (01/26 1147)  HBsAg: Negative (07/03 0000)  HIV: Non-reactive (07/03 0000)  GBS: Negative/-- (01/08 0000)   Assessment/Plan: 64SL [id number] female at 48 0/7wks with chtn, GDMA2, previous cesarean section here for elective repeat c/s - Admit  - ERAS protocol - Verify consent  - To OR when ready    Megan Fields 04/14/2022, 10:36 AM

## 2022-04-14 NOTE — Sedation Documentation (Signed)
Pt transported to [facility] PACU via stretcher accompanied by RN, CRNAs and Anesthesiologist. Megan Fields at bedside to receive pt. Handoff complete. Bilateral groin level 0 and bilateral lower distal pulses palpable. No s/s of distress at this time.

## 2022-04-14 NOTE — Progress Notes (Signed)
Patient ID: Megan Fields, female   DOB: [birth year], 42 y.o.   MRN: [id number] Pt doing well. Has no complaints.   VSS : 102/69, 114,             UOP 947ml/8hrs ( 104/hr)  GEN - NAD ABD - soft, FF and at 1cm below umb,               dressing clean  EXT - no homans   13.1>13.7<188  A/P: POD#0 s/p repeat c/s followed by ex lap and b/l [place] for pp hemorrhage and vital instability - currently stable  - pending cmp tonight; recheck cbc+ cmp in am - discussed possibly getting a CT Abd in am to further investigate cause of anemia and vitals instability.  - pt may eat tonight; NPO after 2am

## 2022-04-14 NOTE — Anesthesia Procedure Notes (Signed)
Spinal  Patient location during procedure: OR Start time: 04/14/2022 1:10 PM End time: 04/14/2022 1:12 PM Reason for block: surgical anesthesia Staffing Performed: anesthesiologist  Anesthesiologist: Lyn Hollingshead, MD Performed by: Lyn Hollingshead, MD Authorized by: Lyn Hollingshead, MD   Preanesthetic Checklist Completed: patient identified, IV checked, site marked, risks and benefits discussed, surgical consent, monitors and equipment checked, pre-op evaluation and timeout performed Spinal Block Patient position: sitting Prep: DuraPrep and site prepped and draped Patient monitoring: continuous pulse ox and blood pressure Approach: midline Location: L3-4 Injection technique: single-shot Needle Needle type: Pencan  Needle gauge: 24 G Needle length: 10 cm Needle insertion depth: 5 cm Assessment Sensory level: T4 Events: CSF return

## 2022-04-14 NOTE — Op Note (Addendum)
Operative Note    Preoperative Diagnosis IUP at 62 1/7wks  Previous cesarean section  Chronic hypertension  GDMA2 AMA   Postoperative Diagnosis: Same    Procedure: Repeat low transverse cesarean section with vacuum assistance, double layered closure and no extensions   Surgeon: Terri Piedra, C DO  Assist: Kathlen Mody, DO     An experienced assistant was required given the standard of surgical care given the complexity of the case and maternal body habitus.  This assistant was needed for exposure, dissection, suctioning, retraction, instrument exchange, assisting with delivery with administration of fundal pressure, and for overall help during the procedure.    Anesthesia: Spinal  Dr Jillyn Hidden MD  Fluids: 3L LR with phenylephrine  EBL: 660ml UOP: 23ml   Findings: Viable female infant in vertex position, APGARS 8,8; Weight pending  Grossly normal uterus, tubes and ovaries   Specimen: Placenta to L/D   Procedure Note Consent verified pre-op. All questions answered   Patient was taken to the operating room where spinal anesthesia was administered. She was prepped and draped in the normal sterile fashion and placed in the dorsal supine position with a leftward tilt. An appropriate time out was performed.   A Pfannenstiel skin incision was then made through the previous incision with the scalpel and carried through to the underlying layer of fascia by sharp dissection and Bovie cautery. The fascia was nicked in the midline and the incision was extended laterally with Mayo scissors. The superior and inferior aspects of the incision were grasped with kocher clamps and dissected off the underlying rectus muscles. The rectus muscles were separated in the midline  and the peritoneal cavity entered bluntly.   The peritoneal incision was then extended both superiorly and inferiorly with careful attention to avoid both bowel and bladder. The Alexis self-retaining wound retractor was then placed  and the lower uterine segment exposed. The bladder flap was developed with Metzenbaum scissors and pushed away from the lower uterine segment.   The lower uterine segment was then incised in a transverse fashion and the cavity itself entered bluntly. The incision was extended bluntly. The infant's head was then lifted and delivered from the incision with vacuum assistance due to size and shape. No difficulty was encountered. No pop off occurred. A loose nuchal x 1 was then reduced over infants head. Vigorous spontaneous cry was noted during delivery.    The remainder of the infant delivered easily and the nose and mouth were bulb suctioned. The cord was clamped and cut after a minute delay and cord blood obtained. The infant was handed off to the waiting NICU team.   The placenta was then manually expressed from the uterus and the uterus cleared of all clots and debris with moist lap sponge. The uterine incision was then repaired in 2 layers the first layer was a running locked layer 1-0 chromic and the second an imbricating layer of the same suture.  An area of bleeding in the right middle part of the incision was noted thus three figure 8 stitches were placed with great hemostasis noted.   The tubes and ovaries were inspected and the gutters cleared of all clots and debris. The uterine incision was also noted to be hemostatic.   All instruments and sponges were then removed from the abdomen. The peritoneum was then reapproximated in a running fashion with sutures of 2-0 Vicryl.  Just before closure of the fascia, CRNA announced patient had low BP and enquired about bleeding. Uterus was noted  to be firm, no active bleeding was noted and urine bag with clear urine, no air. Fundus was also noted to be firm.  The fascia was then closed with 0 Vicryl in a running fashion. Subcutaneous tissue was reapproximated with 3-0 plain in a running fashion. The skin was closed with a subcuticular stitch of 4-0 Vicryl  on a Keith needle and then reinforced with benzoin and Steri-Strips.   At the conclusion of the procedure all instruments and sponge counts were correct.   Patient was taken to the recovery room. She had received 3L of fluid with phenylephrine due to fluctuating BP. She was considered to be in good condition, alert, conversant and with her baby accompanying her skin to skin.

## 2022-04-14 NOTE — Progress Notes (Signed)
Patient ID: Megan Fields, female   DOB: [birth year], 42 y.o.   MRN: [id number]  I advised Krithi's husband and family of the findings in the OR and my recommendation to proceed to IR for a uterine artery embolization  They verbalized understanding and agreement with plan

## 2022-04-14 NOTE — Anesthesia Postprocedure Evaluation (Signed)
Anesthesia Post Note  Patient: Megan Fields  Procedure(s) Performed: EXPLORATORY LAPAROTOMY     Patient location during evaluation: PACU Anesthesia Type: General Level of consciousness: awake and alert and oriented Pain management: pain level controlled Vital Signs Assessment: post-procedure vital signs reviewed and stable Respiratory status: spontaneous breathing, nonlabored ventilation and respiratory function stable Cardiovascular status: blood pressure returned to baseline and tachycardic Postop Assessment: no apparent nausea or vomiting Anesthetic complications: no Comments: Patient tachycardic in PACU with HR 120s, VS otherwise stable. Patient awake and oriented with no complaints, denies pain, urine output approx 200cc since arrival in PACU. Labs with Hgb 13.7, Plts 188k. Plan to admit to [facility] for further monitoring/care. Daiva Huge, MD   No notable events documented.  Last Vitals:  Vitals:   04/14/22 1930 04/14/22 1945  BP: 108/76 99/70  Pulse: (!) 125 (!) 129  Resp: 17 17  Temp:  37 C  SpO2: 95% 97%    Last Pain:  Vitals:   04/14/22 1915  TempSrc: Oral  PainSc:    Pain Goal:                   Marthenia Rolling

## 2022-04-15 ENCOUNTER — Other Ambulatory Visit: Payer: Self-pay

## 2022-04-15 ENCOUNTER — Encounter (HOSPITAL_COMMUNITY): Payer: Self-pay | Admitting: Obstetrics and Gynecology

## 2022-04-15 ENCOUNTER — Inpatient Hospital Stay (HOSPITAL_COMMUNITY): Payer: Managed Care, Other (non HMO)

## 2022-04-15 LAB — COMPREHENSIVE METABOLIC PANEL
ALT: 18 U/L (ref 0–44)
ALT: 19 U/L (ref 0–44)
AST: 42 U/L — ABNORMAL HIGH (ref 15–41)
AST: 45 U/L — ABNORMAL HIGH (ref 15–41)
Albumin: 2.2 g/dL — ABNORMAL LOW (ref 3.5–5.0)
Albumin: 2 g/dL — ABNORMAL LOW (ref 3.5–5.0)
Alkaline Phosphatase: 81 U/L (ref 38–126)
Alkaline Phosphatase: 83 U/L (ref 38–126)
Anion gap: 11 (ref 5–15)
Anion gap: 7 (ref 5–15)
BUN: 9 mg/dL (ref 6–20)
BUN: 8 mg/dL (ref 6–20)
CO2: 23 mmol/L (ref 22–32)
CO2: 24 mmol/L (ref 22–32)
Calcium: 7.6 mg/dL — ABNORMAL LOW (ref 8.9–10.3)
Calcium: 7.6 mg/dL — ABNORMAL LOW (ref 8.9–10.3)
Chloride: 104 mmol/L (ref 98–111)
Chloride: 106 mmol/L (ref 98–111)
Creatinine, Ser: 0.87 mg/dL (ref 0.44–1.00)
Creatinine, Ser: 0.79 mg/dL (ref 0.44–1.00)
GFR, Estimated: >60 mL/min (ref >60)
GFR, Estimated: >60 mL/min (ref >60)
Glucose, Bld: 77 mg/dL (ref 70–99)
Glucose, Bld: 73 mg/dL (ref 70–99)
Potassium: 3 mmol/L — ABNORMAL LOW (ref 3.5–5.1)
Potassium: 2.6 mmol/L — CL (ref 3.5–5.1)
Sodium: 138 mmol/L (ref 135–145)
Sodium: 137 mmol/L (ref 135–145)
Total Bilirubin: 2.8 mg/dL — ABNORMAL HIGH (ref 0.3–1.2)
Total Bilirubin: 1.7 mg/dL — ABNORMAL HIGH (ref 0.3–1.2)
Total Protein: 4.7 g/dL — ABNORMAL LOW (ref 6.5–8.1)
Total Protein: 4.5 g/dL — ABNORMAL LOW (ref 6.5–8.1)

## 2022-04-15 LAB — CBC
HCT: 34.9 % — ABNORMAL LOW (ref 36.0–46.0)
Hemoglobin: 12.3 g/dL (ref 12.0–15.0)
MCH: 31.1 pg (ref 26.0–34.0)
MCHC: 35.2 g/dL (ref 30.0–36.0)
MCV: 88.1 fL (ref 80.0–100.0)
Platelets: 161 10*3/uL (ref 150–400)
RBC: 3.96 MIL/uL (ref 3.87–5.11)
RDW: 13.3 % (ref 11.5–15.5)
WBC: 9.2 10*3/uL (ref 4.0–10.5)
nRBC: 0 % (ref 0.0–0.2)

## 2022-04-15 LAB — BPAM FFP
Blood Product Expiration Date: 202402032359
Blood Product Expiration Date: 202402032359
Blood Product Expiration Date: 202402032359
Blood Product Expiration Date: 202402032359
ISSUE DATE / TIME: 202401291633
ISSUE DATE / TIME: 202401291633
ISSUE DATE / TIME: 202401291633
ISSUE DATE / TIME: 202401291633
Unit Type and Rh: 7300
Unit Type and Rh: 7300
Unit Type and Rh: 7300
Unit Type and Rh: 7300

## 2022-04-15 LAB — BPAM PLATELET PHERESIS
Blood Product Expiration Date: 202401312359
Blood Product Expiration Date: 202402012359
ISSUE DATE / TIME: 202401291606
ISSUE DATE / TIME: 202401291606
Unit Type and Rh: 5100
Unit Type and Rh: 6200

## 2022-04-15 LAB — PREPARE FRESH FROZEN PLASMA
Unit division: 0
Unit division: 0
Unit division: 0

## 2022-04-15 LAB — GLUCOSE, CAPILLARY: Glucose-Capillary: 86 mg/dL (ref 70–99)

## 2022-04-15 LAB — PREPARE PLATELET PHERESIS
Unit division: 0
Unit division: 0

## 2022-04-15 MED ORDER — POTASSIUM CHLORIDE 10 MEQ/100ML IV SOLN
10.0000 meq | INTRAVENOUS | Status: AC
  Administered 2022-04-15 (×4): 10 meq via INTRAVENOUS
  Filled 2022-04-15 (×4): qty 100

## 2022-04-15 MED ORDER — DOCUSATE SODIUM 100 MG PO CAPS: 100.0000 mg | ORAL_CAPSULE | Freq: Two times a day (BID) | ORAL | Status: DC

## 2022-04-15 MED ORDER — LACTATED RINGERS IV SOLN: INTRAVENOUS | Status: DC

## 2022-04-15 NOTE — Lactation Note (Signed)
This note was copied from a baby's chart. Lactation Consultation Note  Patient Name: Megan Fields [id number] Date: 04/15/2022   Age:41 hours Mom not feeling well at this time. Mom awake, room dark. FOB holding baby. DEBP set up at bedside. Mom stated she isn't going to BF just pump when she feels better. Asked mom to call for assistance or questions. Maternal Data    Feeding Nipple Type: Slow - flow  LATCH Score                    Lactation Tools Discussed/Used    Interventions    Discharge    Consult Status      Theodoro Kalata 04/15/2022, 9:09 PM

## 2022-04-15 NOTE — Progress Notes (Signed)
Referring Physician(s): Dr Terri Piedra  Supervising Physician: Daryll Brod  Patient Status:  [facility] - In-pt  Chief Complaint:  Post partum blood loss  Subjective:  IR 04/14/22:  Procedure: bilateral internal iliac gelfoam embo for [place]  (post partum hemorrhage) Complications: None Estimated Blood Loss:  min  Pt is up in bed Doing well No complaints Has not been out of bed yet Foley catheter in place     Allergies: Shellfish allergy  Medications: Prior to Admission medications   Medication Sig Start Date End Date Taking? Authorizing Provider  albuterol (VENTOLIN HFA) 108 (90 Base) MCG/ACT inhaler Inhale 2 puffs into the lungs every 6 (six) hours as needed for wheezing or shortness of breath.   Yes [provider]  aspirin EC 81 MG tablet Take 81 mg by mouth daily. Swallow whole.   Yes [provider]  docusate sodium (COLACE) 100 MG capsule Take 100 mg by mouth daily.   Yes [provider]  hydrochlorothiazide (HYDRODIURIL) 25 MG tablet Take 25 mg by mouth daily.   Yes [provider]  hydrocortisone 2.5 % cream Apply 1 Application topically daily. 03/26/22  Yes [provider]  ipratropium (ATROVENT HFA) 17 MCG/ACT inhaler Inhale 2 puffs into the lungs every 6 (six) hours as needed for wheezing.   Yes [provider]  metFORMIN (GLUCOPHAGE-XR) 500 MG 24 hr tablet Take 500 mg by mouth daily after supper.   Yes [provider]  montelukast (SINGULAIR) 10 MG tablet Take 10 mg by mouth daily.   Yes [provider]  polyethylene glycol (MIRALAX / GLYCOLAX) 17 g packet Take 17 g by mouth daily.   Yes [provider]  Prenatal Vit-Fe Fumarate-FA (PRENATAL MULTIVITAMIN) TABS tablet Take 1 tablet by mouth daily.   Yes [provider]  triamcinolone cream (KENALOG) 0.1 % Apply 1 Application topically daily.   Yes [provider]     Vital Signs: BP 106/69 (BP Location: Left Arm)    Pulse 93   Temp 97.6 F (36.4 C) (Oral)   Resp 17   Ht 5\' 3"  (1.6 m)   Wt 194 lb (88 kg)   SpO2 98%   Breastfeeding Unknown   BMI 34.37 kg/m   Physical Exam Vitals reviewed.  Skin:    General: Skin is warm.     Comments: Bilat groin sites NT no bleeding Soft No hematoma  Bilat pedal pulses: 2+  Neurological:     Mental Status: She is alert and oriented to person, place, and time.  Psychiatric:        Behavior: Behavior normal.     Imaging: CT ABDOMEN PELVIS WO CONTRAST  Result Date: 04/15/2022 CLINICAL DATA:  Anemia status post Caesarean section yesterday. Possible postpartum hemorrhage. EXAM: CT ABDOMEN AND PELVIS WITHOUT CONTRAST TECHNIQUE: Multidetector CT imaging of the abdomen and pelvis was performed following the standard protocol without IV contrast. RADIATION DOSE REDUCTION: This exam was performed according to the departmental dose-optimization program which includes automated exposure control, adjustment of the mA and/or kV according to patient size and/or use of iterative reconstruction technique. COMPARISON:  None Available. FINDINGS: Lower chest: Small bilateral pleural effusions are noted with minimal adjacent subsegmental atelectasis. Hepatobiliary: Minimal cholelithiasis. No biliary dilatation is noted. Liver is unremarkable on these unenhanced images. Pancreas: Unremarkable. No pancreatic ductal dilatation or surrounding inflammatory changes. Spleen: Normal in size without focal abnormality. Adrenals/Urinary Tract: Adrenal glands and kidneys are unremarkable. No hydronephrosis or renal obstruction is noted. Urinary bladder is  decompressed secondary to Foley catheter. Stomach/Bowel: Small sliding-type hiatal hernia. There is no evidence of bowel obstruction or inflammation. The appendix is unremarkable. Vascular/Lymphatic: No significant vascular findings are present. No enlarged abdominal or pelvic lymph nodes. Reproductive: Enlarged uterus is noted consistent with  postpartum status. Small amount of free fluid is noted in the pelvis as well as postsurgical changes involving the subcutaneous tissues of the anterior pelvic wall consistent with history of recent Caesarean section. Other: No definite hernia is noted. Small amount of high density material is noted posteriorly in the pelvis as well as anteriorly in the pelvis most likely representing postoperative hemorrhage. No evidence of large retroperitoneal hemorrhage is noted Musculoskeletal: Probable hemangioma seen in L4 vertebral body. No acute abnormality is noted. IMPRESSION: Enlarged uterus consistent with postpartum status. Small amount of free fluid is noted in the pelvis as well as postsurgical changes involving subcutaneous tissues of anterior pelvic wall consistent with history of recent Caesarean section. Small amount of hemorrhage is noted anteriorly and posteriorly in the pelvis which most likely is related to recent postsurgical status. No large retroperitoneal hematoma is noted. These results will be called to the ordering clinician or representative by the Radiologist Assistant, and communication documented in the PACS or zVision Dashboard. Electronically Signed   By: Lupita RaiderJames  Green Jr M.D.   On: 04/15/2022 10:49   IR EMBO ART  VEN HEMORR LYMPH EXTRAV  INC GUIDE ROADMAPPING  Result Date: 04/15/2022 INDICATION: Postpartum hemorrhage, acute blood loss anemia requiring massive transfusion protocol EXAM: ULTRASOUND GUIDANCE FOR VASCULAR ACCESS BILATERALLY BILATERAL ANTERIOR DIVISION INTERNAL ILIAC CATHETERIZATIONS, ANGIOGRAMS, AND GEL-FOAM EMBOLIZATION FOR POSTPARTUM HEMORRHAGE. MEDICATIONS: NONE. The antibiotic was administered within 1 hour of the procedure ANESTHESIA/SEDATION: General anesthesia CONTRAST:  100 cc omni 300 FLUOROSCOPY: Radiation Exposure Index (as provided by the fluoroscopic device): 947 mGy Kerma COMPLICATIONS: None immediate. PROCEDURE: Emergent informed consent was obtained from the  patient's husband by Dr. Mindi SlickerBanga, D.O. following explanation of the procedure, risks, benefits and alternatives. The patient's husband understands, agrees and consents for the procedure. All questions were addressed. A time out was performed prior to the initiation of the procedure. Maximal barrier sterile technique utilized including caps, mask, sterile gowns, sterile gloves, large sterile drape, hand hygiene, and Betadine prep. Under sterile conditions and local anesthesia, ultrasound micropuncture access performed of the common femoral arteries bilaterally. Images obtained for documentation of the patent common femoral arteries. Five French sheaths inserted over Bentson guidewires. C2 catheters utilized to select the internal iliac arteries bilaterally. selective internal iliac angiograms performed. Internal iliac angiograms: Internal iliac arteries are widely patent including the peripheral branches. Enlarged bilateral uterine vasculature related to the immediate postpartum state. No active contrast extravasation identified angiographically. Initially, attempts were made with the C2 catheter to select the left uterine artery. During this time, additional anterior division branches of the left internal iliac artery were selected and injected with contrast. These included the obturator and pudendal branches. These branches appear hypervascular as well related to the postpartum state. No active bleeding demonstrated. Catheter retracted to the left internal iliac anterior division orifice. In a similar fashion, the right anterior division internal iliac artery was selected with a C2 catheter. Selective angiogram performed. Similarly, the right uterine artery is enlarged and tortuous related to the postpartum state. Hypervascularity noted throughout the additional right anterior division branches. No active bleeding demonstrated. Emergent bilateral anterior division internal iliac artery Gel-Foam embolization: through  the C2 catheters, Gel-Foam slurry embolization was performed of the internal iliac anterior  divisions bilaterally to near complete stasis. Post embolization angiogram again confirms stasis throughout the internal iliac anterior divisions with occlusion of the dominant uterine vasculature bilaterally. Hemostasis obtained bilaterally with the CELT device. No immediate complication. Patient tolerated the procedure well. IMPRESSION: Successful bilateral emergent Gel-Foam embolization of the internal iliac anterior divisions as detailed above for postpartum hemorrhage and acute blood loss anemia. Electronically Signed   By: Judie PetitM.  Shick M.D.   On: 04/15/2022 08:48   IR US Guide Vasc Access Right  Result Date: 04/15/2022 INDICATION: Postpartum hemorrhage, acute blood loss anemia requiring massive transfusion protocol EXAM: ULTRASOUND GUIDANCE FOR VASCULAR ACCESS BILATERALLY BILATERAL ANTERIOR DIVISION INTERNAL ILIAC CATHETERIZATIONS, ANGIOGRAMS, AND GEL-FOAM EMBOLIZATION FOR POSTPARTUM HEMORRHAGE. MEDICATIONS: NONE. The antibiotic was administered within 1 hour of the procedure ANESTHESIA/SEDATION: General anesthesia CONTRAST:  100 cc omni 300 FLUOROSCOPY: Radiation Exposure Index (as provided by the fluoroscopic device): 947 mGy Kerma COMPLICATIONS: None immediate. PROCEDURE: Emergent informed consent was obtained from the patient's husband by Dr. Mindi SlickerBanga, D.O. following explanation of the procedure, risks, benefits and alternatives. The patient's husband understands, agrees and consents for the procedure. All questions were addressed. A time out was performed prior to the initiation of the procedure. Maximal barrier sterile technique utilized including caps, mask, sterile gowns, sterile gloves, large sterile drape, hand hygiene, and Betadine prep. Under sterile conditions and local anesthesia, ultrasound micropuncture access performed of the common femoral arteries bilaterally. Images obtained for documentation of the  patent common femoral arteries. Five French sheaths inserted over Bentson guidewires. C2 catheters utilized to select the internal iliac arteries bilaterally. selective internal iliac angiograms performed. Internal iliac angiograms: Internal iliac arteries are widely patent including the peripheral branches. Enlarged bilateral uterine vasculature related to the immediate postpartum state. No active contrast extravasation identified angiographically. Initially, attempts were made with the C2 catheter to select the left uterine artery. During this time, additional anterior division branches of the left internal iliac artery were selected and injected with contrast. These included the obturator and pudendal branches. These branches appear hypervascular as well related to the postpartum state. No active bleeding demonstrated. Catheter retracted to the left internal iliac anterior division orifice. In a similar fashion, the right anterior division internal iliac artery was selected with a C2 catheter. Selective angiogram performed. Similarly, the right uterine artery is enlarged and tortuous related to the postpartum state. Hypervascularity noted throughout the additional right anterior division branches. No active bleeding demonstrated. Emergent bilateral anterior division internal iliac artery Gel-Foam embolization: through the C2 catheters, Gel-Foam slurry embolization was performed of the internal iliac anterior divisions bilaterally to near complete stasis. Post embolization angiogram again confirms stasis throughout the internal iliac anterior divisions with occlusion of the dominant uterine vasculature bilaterally. Hemostasis obtained bilaterally with the CELT device. No immediate complication. Patient tolerated the procedure well. IMPRESSION: Successful bilateral emergent Gel-Foam embolization of the internal iliac anterior divisions as detailed above for postpartum hemorrhage and acute blood loss anemia.  Electronically Signed   By: Judie PetitM.  Shick M.D.   On: 04/15/2022 08:48   IR US Guide Vasc Access Left  Result Date: 04/15/2022 INDICATION: Postpartum hemorrhage, acute blood loss anemia requiring massive transfusion protocol EXAM: ULTRASOUND GUIDANCE FOR VASCULAR ACCESS BILATERALLY BILATERAL ANTERIOR DIVISION INTERNAL ILIAC CATHETERIZATIONS, ANGIOGRAMS, AND GEL-FOAM EMBOLIZATION FOR POSTPARTUM HEMORRHAGE. MEDICATIONS: NONE. The antibiotic was administered within 1 hour of the procedure ANESTHESIA/SEDATION: General anesthesia CONTRAST:  100 cc omni 300 FLUOROSCOPY: Radiation Exposure Index (as provided by the fluoroscopic device): 947 mGy Kerma COMPLICATIONS: None immediate. PROCEDURE:  Emergent informed consent was obtained from the patient's husband by Dr. Terri Piedra, D.O. following explanation of the procedure, risks, benefits and alternatives. The patient's husband understands, agrees and consents for the procedure. All questions were addressed. A time out was performed prior to the initiation of the procedure. Maximal barrier sterile technique utilized including caps, mask, sterile gowns, sterile gloves, large sterile drape, hand hygiene, and Betadine prep. Under sterile conditions and local anesthesia, ultrasound micropuncture access performed of the common femoral arteries bilaterally. Images obtained for documentation of the patent common femoral arteries. Five French sheaths inserted over Bentson guidewires. C2 catheters utilized to select the internal iliac arteries bilaterally. selective internal iliac angiograms performed. Internal iliac angiograms: Internal iliac arteries are widely patent including the peripheral branches. Enlarged bilateral uterine vasculature related to the immediate postpartum state. No active contrast extravasation identified angiographically. Initially, attempts were made with the C2 catheter to select the left uterine artery. During this time, additional anterior division branches of the  left internal iliac artery were selected and injected with contrast. These included the obturator and pudendal branches. These branches appear hypervascular as well related to the postpartum state. No active bleeding demonstrated. Catheter retracted to the left internal iliac anterior division orifice. In a similar fashion, the right anterior division internal iliac artery was selected with a C2 catheter. Selective angiogram performed. Similarly, the right uterine artery is enlarged and tortuous related to the postpartum state. Hypervascularity noted throughout the additional right anterior division branches. No active bleeding demonstrated. Emergent bilateral anterior division internal iliac artery Gel-Foam embolization: through the C2 catheters, Gel-Foam slurry embolization was performed of the internal iliac anterior divisions bilaterally to near complete stasis. Post embolization angiogram again confirms stasis throughout the internal iliac anterior divisions with occlusion of the dominant uterine vasculature bilaterally. Hemostasis obtained bilaterally with the CELT device. No immediate complication. Patient tolerated the procedure well. IMPRESSION: Successful bilateral emergent Gel-Foam embolization of the internal iliac anterior divisions as detailed above for postpartum hemorrhage and acute blood loss anemia. Electronically Signed   By: Jerilynn Mages.  Shick M.D.   On: 04/15/2022 08:48   IR Angiogram Pelvis Selective Or Supraselective  Result Date: 04/15/2022 INDICATION: Postpartum hemorrhage, acute blood loss anemia requiring massive transfusion protocol EXAM: ULTRASOUND GUIDANCE FOR VASCULAR ACCESS BILATERALLY BILATERAL ANTERIOR DIVISION INTERNAL ILIAC CATHETERIZATIONS, ANGIOGRAMS, AND GEL-FOAM EMBOLIZATION FOR POSTPARTUM HEMORRHAGE. MEDICATIONS: NONE. The antibiotic was administered within 1 hour of the procedure ANESTHESIA/SEDATION: General anesthesia CONTRAST:  100 cc omni 300 FLUOROSCOPY: Radiation Exposure  Index (as provided by the fluoroscopic device): 161 mGy Kerma COMPLICATIONS: None immediate. PROCEDURE: Emergent informed consent was obtained from the patient's husband by Dr. Terri Piedra, D.O. following explanation of the procedure, risks, benefits and alternatives. The patient's husband understands, agrees and consents for the procedure. All questions were addressed. A time out was performed prior to the initiation of the procedure. Maximal barrier sterile technique utilized including caps, mask, sterile gowns, sterile gloves, large sterile drape, hand hygiene, and Betadine prep. Under sterile conditions and local anesthesia, ultrasound micropuncture access performed of the common femoral arteries bilaterally. Images obtained for documentation of the patent common femoral arteries. Five French sheaths inserted over Bentson guidewires. C2 catheters utilized to select the internal iliac arteries bilaterally. selective internal iliac angiograms performed. Internal iliac angiograms: Internal iliac arteries are widely patent including the peripheral branches. Enlarged bilateral uterine vasculature related to the immediate postpartum state. No active contrast extravasation identified angiographically. Initially, attempts were made with the C2 catheter to select the left uterine artery.  During this time, additional anterior division branches of the left internal iliac artery were selected and injected with contrast. These included the obturator and pudendal branches. These branches appear hypervascular as well related to the postpartum state. No active bleeding demonstrated. Catheter retracted to the left internal iliac anterior division orifice. In a similar fashion, the right anterior division internal iliac artery was selected with a C2 catheter. Selective angiogram performed. Similarly, the right uterine artery is enlarged and tortuous related to the postpartum state. Hypervascularity noted throughout the additional right  anterior division branches. No active bleeding demonstrated. Emergent bilateral anterior division internal iliac artery Gel-Foam embolization: through the C2 catheters, Gel-Foam slurry embolization was performed of the internal iliac anterior divisions bilaterally to near complete stasis. Post embolization angiogram again confirms stasis throughout the internal iliac anterior divisions with occlusion of the dominant uterine vasculature bilaterally. Hemostasis obtained bilaterally with the CELT device. No immediate complication. Patient tolerated the procedure well. IMPRESSION: Successful bilateral emergent Gel-Foam embolization of the internal iliac anterior divisions as detailed above for postpartum hemorrhage and acute blood loss anemia. Electronically Signed   By: Jerilynn Mages.  Shick M.D.   On: 04/15/2022 08:48   IR Angiogram Pelvis Selective Or Supraselective  Result Date: 04/15/2022 INDICATION: Postpartum hemorrhage, acute blood loss anemia requiring massive transfusion protocol EXAM: ULTRASOUND GUIDANCE FOR VASCULAR ACCESS BILATERALLY BILATERAL ANTERIOR DIVISION INTERNAL ILIAC CATHETERIZATIONS, ANGIOGRAMS, AND GEL-FOAM EMBOLIZATION FOR POSTPARTUM HEMORRHAGE. MEDICATIONS: NONE. The antibiotic was administered within 1 hour of the procedure ANESTHESIA/SEDATION: General anesthesia CONTRAST:  100 cc omni 300 FLUOROSCOPY: Radiation Exposure Index (as provided by the fluoroscopic device): 720 mGy Kerma COMPLICATIONS: None immediate. PROCEDURE: Emergent informed consent was obtained from the patient's husband by Dr. Terri Piedra, D.O. following explanation of the procedure, risks, benefits and alternatives. The patient's husband understands, agrees and consents for the procedure. All questions were addressed. A time out was performed prior to the initiation of the procedure. Maximal barrier sterile technique utilized including caps, mask, sterile gowns, sterile gloves, large sterile drape, hand hygiene, and Betadine prep. Under  sterile conditions and local anesthesia, ultrasound micropuncture access performed of the common femoral arteries bilaterally. Images obtained for documentation of the patent common femoral arteries. Five French sheaths inserted over Bentson guidewires. C2 catheters utilized to select the internal iliac arteries bilaterally. selective internal iliac angiograms performed. Internal iliac angiograms: Internal iliac arteries are widely patent including the peripheral branches. Enlarged bilateral uterine vasculature related to the immediate postpartum state. No active contrast extravasation identified angiographically. Initially, attempts were made with the C2 catheter to select the left uterine artery. During this time, additional anterior division branches of the left internal iliac artery were selected and injected with contrast. These included the obturator and pudendal branches. These branches appear hypervascular as well related to the postpartum state. No active bleeding demonstrated. Catheter retracted to the left internal iliac anterior division orifice. In a similar fashion, the right anterior division internal iliac artery was selected with a C2 catheter. Selective angiogram performed. Similarly, the right uterine artery is enlarged and tortuous related to the postpartum state. Hypervascularity noted throughout the additional right anterior division branches. No active bleeding demonstrated. Emergent bilateral anterior division internal iliac artery Gel-Foam embolization: through the C2 catheters, Gel-Foam slurry embolization was performed of the internal iliac anterior divisions bilaterally to near complete stasis. Post embolization angiogram again confirms stasis throughout the internal iliac anterior divisions with occlusion of the dominant uterine vasculature bilaterally. Hemostasis obtained bilaterally with the CELT device. No immediate complication. Patient tolerated  the procedure well. IMPRESSION:  Successful bilateral emergent Gel-Foam embolization of the internal iliac anterior divisions as detailed above for postpartum hemorrhage and acute blood loss anemia. Electronically Signed   By: Judie Petit.  Shick M.D.   On: 04/15/2022 08:48   IR Angiogram Selective Each Additional Vessel  Result Date: 04/15/2022 INDICATION: Postpartum hemorrhage, acute blood loss anemia requiring massive transfusion protocol EXAM: ULTRASOUND GUIDANCE FOR VASCULAR ACCESS BILATERALLY BILATERAL ANTERIOR DIVISION INTERNAL ILIAC CATHETERIZATIONS, ANGIOGRAMS, AND GEL-FOAM EMBOLIZATION FOR POSTPARTUM HEMORRHAGE. MEDICATIONS: NONE. The antibiotic was administered within 1 hour of the procedure ANESTHESIA/SEDATION: General anesthesia CONTRAST:  100 cc omni 300 FLUOROSCOPY: Radiation Exposure Index (as provided by the fluoroscopic device): 947 mGy Kerma COMPLICATIONS: None immediate. PROCEDURE: Emergent informed consent was obtained from the patient's husband by Dr. Mindi Slicker, D.O. following explanation of the procedure, risks, benefits and alternatives. The patient's husband understands, agrees and consents for the procedure. All questions were addressed. A time out was performed prior to the initiation of the procedure. Maximal barrier sterile technique utilized including caps, mask, sterile gowns, sterile gloves, large sterile drape, hand hygiene, and Betadine prep. Under sterile conditions and local anesthesia, ultrasound micropuncture access performed of the common femoral arteries bilaterally. Images obtained for documentation of the patent common femoral arteries. Five French sheaths inserted over Bentson guidewires. C2 catheters utilized to select the internal iliac arteries bilaterally. selective internal iliac angiograms performed. Internal iliac angiograms: Internal iliac arteries are widely patent including the peripheral branches. Enlarged bilateral uterine vasculature related to the immediate postpartum state. No active contrast  extravasation identified angiographically. Initially, attempts were made with the C2 catheter to select the left uterine artery. During this time, additional anterior division branches of the left internal iliac artery were selected and injected with contrast. These included the obturator and pudendal branches. These branches appear hypervascular as well related to the postpartum state. No active bleeding demonstrated. Catheter retracted to the left internal iliac anterior division orifice. In a similar fashion, the right anterior division internal iliac artery was selected with a C2 catheter. Selective angiogram performed. Similarly, the right uterine artery is enlarged and tortuous related to the postpartum state. Hypervascularity noted throughout the additional right anterior division branches. No active bleeding demonstrated. Emergent bilateral anterior division internal iliac artery Gel-Foam embolization: through the C2 catheters, Gel-Foam slurry embolization was performed of the internal iliac anterior divisions bilaterally to near complete stasis. Post embolization angiogram again confirms stasis throughout the internal iliac anterior divisions with occlusion of the dominant uterine vasculature bilaterally. Hemostasis obtained bilaterally with the CELT device. No immediate complication. Patient tolerated the procedure well. IMPRESSION: Successful bilateral emergent Gel-Foam embolization of the internal iliac anterior divisions as detailed above for postpartum hemorrhage and acute blood loss anemia. Electronically Signed   By: Judie Petit.  Shick M.D.   On: 04/15/2022 08:48   IR Angiogram Selective Each Additional Vessel  Result Date: 04/15/2022 INDICATION: Postpartum hemorrhage, acute blood loss anemia requiring massive transfusion protocol EXAM: ULTRASOUND GUIDANCE FOR VASCULAR ACCESS BILATERALLY BILATERAL ANTERIOR DIVISION INTERNAL ILIAC CATHETERIZATIONS, ANGIOGRAMS, AND GEL-FOAM EMBOLIZATION FOR POSTPARTUM  HEMORRHAGE. MEDICATIONS: NONE. The antibiotic was administered within 1 hour of the procedure ANESTHESIA/SEDATION: General anesthesia CONTRAST:  100 cc omni 300 FLUOROSCOPY: Radiation Exposure Index (as provided by the fluoroscopic device): 947 mGy Kerma COMPLICATIONS: None immediate. PROCEDURE: Emergent informed consent was obtained from the patient's husband by Dr. Mindi Slicker, D.O. following explanation of the procedure, risks, benefits and alternatives. The patient's husband understands, agrees and consents for the procedure. All questions were addressed. A  time out was performed prior to the initiation of the procedure. Maximal barrier sterile technique utilized including caps, mask, sterile gowns, sterile gloves, large sterile drape, hand hygiene, and Betadine prep. Under sterile conditions and local anesthesia, ultrasound micropuncture access performed of the common femoral arteries bilaterally. Images obtained for documentation of the patent common femoral arteries. Five French sheaths inserted over Bentson guidewires. C2 catheters utilized to select the internal iliac arteries bilaterally. selective internal iliac angiograms performed. Internal iliac angiograms: Internal iliac arteries are widely patent including the peripheral branches. Enlarged bilateral uterine vasculature related to the immediate postpartum state. No active contrast extravasation identified angiographically. Initially, attempts were made with the C2 catheter to select the left uterine artery. During this time, additional anterior division branches of the left internal iliac artery were selected and injected with contrast. These included the obturator and pudendal branches. These branches appear hypervascular as well related to the postpartum state. No active bleeding demonstrated. Catheter retracted to the left internal iliac anterior division orifice. In a similar fashion, the right anterior division internal iliac artery was selected with a  C2 catheter. Selective angiogram performed. Similarly, the right uterine artery is enlarged and tortuous related to the postpartum state. Hypervascularity noted throughout the additional right anterior division branches. No active bleeding demonstrated. Emergent bilateral anterior division internal iliac artery Gel-Foam embolization: through the C2 catheters, Gel-Foam slurry embolization was performed of the internal iliac anterior divisions bilaterally to near complete stasis. Post embolization angiogram again confirms stasis throughout the internal iliac anterior divisions with occlusion of the dominant uterine vasculature bilaterally. Hemostasis obtained bilaterally with the CELT device. No immediate complication. Patient tolerated the procedure well. IMPRESSION: Successful bilateral emergent Gel-Foam embolization of the internal iliac anterior divisions as detailed above for postpartum hemorrhage and acute blood loss anemia. Electronically Signed   By: Judie Petit.  Shick M.D.   On: 04/15/2022 08:48   IR Angiogram Selective Each Additional Vessel  Result Date: 04/15/2022 INDICATION: Postpartum hemorrhage, acute blood loss anemia requiring massive transfusion protocol EXAM: ULTRASOUND GUIDANCE FOR VASCULAR ACCESS BILATERALLY BILATERAL ANTERIOR DIVISION INTERNAL ILIAC CATHETERIZATIONS, ANGIOGRAMS, AND GEL-FOAM EMBOLIZATION FOR POSTPARTUM HEMORRHAGE. MEDICATIONS: NONE. The antibiotic was administered within 1 hour of the procedure ANESTHESIA/SEDATION: General anesthesia CONTRAST:  100 cc omni 300 FLUOROSCOPY: Radiation Exposure Index (as provided by the fluoroscopic device): 947 mGy Kerma COMPLICATIONS: None immediate. PROCEDURE: Emergent informed consent was obtained from the patient's husband by Dr. Mindi Slicker, D.O. following explanation of the procedure, risks, benefits and alternatives. The patient's husband understands, agrees and consents for the procedure. All questions were addressed. A time out was performed prior to  the initiation of the procedure. Maximal barrier sterile technique utilized including caps, mask, sterile gowns, sterile gloves, large sterile drape, hand hygiene, and Betadine prep. Under sterile conditions and local anesthesia, ultrasound micropuncture access performed of the common femoral arteries bilaterally. Images obtained for documentation of the patent common femoral arteries. Five French sheaths inserted over Bentson guidewires. C2 catheters utilized to select the internal iliac arteries bilaterally. selective internal iliac angiograms performed. Internal iliac angiograms: Internal iliac arteries are widely patent including the peripheral branches. Enlarged bilateral uterine vasculature related to the immediate postpartum state. No active contrast extravasation identified angiographically. Initially, attempts were made with the C2 catheter to select the left uterine artery. During this time, additional anterior division branches of the left internal iliac artery were selected and injected with contrast. These included the obturator and pudendal branches. These branches appear hypervascular as well related to the postpartum state.  No active bleeding demonstrated. Catheter retracted to the left internal iliac anterior division orifice. In a similar fashion, the right anterior division internal iliac artery was selected with a C2 catheter. Selective angiogram performed. Similarly, the right uterine artery is enlarged and tortuous related to the postpartum state. Hypervascularity noted throughout the additional right anterior division branches. No active bleeding demonstrated. Emergent bilateral anterior division internal iliac artery Gel-Foam embolization: through the C2 catheters, Gel-Foam slurry embolization was performed of the internal iliac anterior divisions bilaterally to near complete stasis. Post embolization angiogram again confirms stasis throughout the internal iliac anterior divisions with  occlusion of the dominant uterine vasculature bilaterally. Hemostasis obtained bilaterally with the CELT device. No immediate complication. Patient tolerated the procedure well. IMPRESSION: Successful bilateral emergent Gel-Foam embolization of the internal iliac anterior divisions as detailed above for postpartum hemorrhage and acute blood loss anemia. Electronically Signed   By: Judie Petit.  Shick M.D.   On: 04/15/2022 08:48    Labs:  CBC: Recent Labs    04/11/22 1147 04/14/22 1423 04/14/22 1511 04/14/22 1738 04/14/22 1909 04/14/22 1910 04/15/22 0520  WBC 7.0  --  6.3  --   --  13.1* 9.2  HGB 12.6   < > 6.2* 12.9  --  13.7 12.3  HCT 35.6*   < > 17.5* 38.0  --  37.7 34.9*  PLT 233  --  113*  --  179 188 161   < > = values in this interval not displayed.    COAGS: Recent Labs    04/14/22 1511 04/14/22 1909  INR 1.5* 1.1  APTT 38* 31    BMP: Recent Labs    04/14/22 1511 04/14/22 1738 04/14/22 2301 04/15/22 0520  NA 133* 138 138 137  K 3.7 3.8 3.0* 2.6*  CL 107  --  104 106  CO2 14*  --  23 24  GLUCOSE 51*  --  77 73  BUN 5*  --  9 8  CALCIUM 6.6*  --  7.6* 7.6*  CREATININE 0.37*  --  0.87 0.79  GFRNONAA >60  --  >60 >60    LIVER FUNCTION TESTS: Recent Labs    04/14/22 1511 04/14/22 2301 04/15/22 0520  BILITOT 0.5 2.8* 1.7*  AST 12* 42* 45*  ALT 9 18 19   ALKPHOS 45 81 83  PROT <3.0* 4.7* 4.5*  ALBUMIN <1.5* 2.2* 2.0*    Assessment and Plan:  Post partum blood loss- hemorrhage Embolization in IR:  bilateral internal iliac gelfoam embo for  (post partum hemorrhage) Doing well Will follow another day  Electronically Signed: [place], PA-C 04/15/2022, 2:08 PM   I spent a total of 15 Minutes at the the patient's bedside AND on the patient's hospital floor or unit, greater than 50% of which was counseling/coordinating care for post partum embolization

## 2022-04-15 NOTE — Progress Notes (Signed)
Subjective: Postpartum Day 1: Cesarean Delivery Megan Fields is feeling well this morning without complaints. Pain is controlled. Denies lightheadedness or dizziness. Has not yet ambulated, foley in place. Tolerated PO last night but has been NPO since midnight. No nausea/vomiting. Hungry for breakfast this AM.   Objective: Patient Vitals for the past 24 hrs:  BP Temp Temp src Pulse Resp SpO2 Height Weight  04/15/22 0359 101/61 98.5 F (36.9 C) -- 90 17 95 % -- --  04/15/22 0238 104/66 -- -- 91 18 93 % -- --  04/15/22 0124 96/65 99.5 F (37.5 C) Oral 94 17 98 % -- --  04/15/22 0012 101/62 -- -- 95 18 96 % -- --  04/14/22 2305 97/72 99.2 F (37.3 C) -- (!) 110 18 97 % -- --  04/14/22 2207 102/69 -- -- (!) 114 17 96 % -- --  04/14/22 2130 98/68 -- -- (!) 121 18 95 % -- --  04/14/22 2100 96/74 98.4 F (36.9 C) Oral -- 20 -- -- --  04/14/22 2045 91/72 -- -- (!) 123 18 95 % -- --  04/14/22 2030 97/73 -- -- (!) 128 (!) 21 95 % -- --  04/14/22 2015 97/75 -- -- (!) 128 17 96 % -- --  04/14/22 2003 101/77 -- -- (!) 129 18 94 % -- --  04/14/22 1945 99/70 98.6 F (37 C) -- (!) 129 17 97 % -- --  04/14/22 1930 108/76 -- -- (!) 125 17 95 % -- --  04/14/22 1915 107/81 98.2 F (36.8 C) Oral (!) 130 19 -- -- --  04/14/22 1855 -- -- -- (!) 135 (!) 26 91 % -- --  04/14/22 1843 (!) 155/118 98.6 F (37 C) Oral (!) 147 (!) 27 -- -- --  04/14/22 1533 103/70 -- -- (!) 107 19 100 % -- --  04/14/22 1520 110/64 -- -- (!) 114 15 -- -- --  04/14/22 1515 (!) 138/98 -- -- (!) 114 (!) 21 100 % -- --  04/14/22 1505 103/71 -- -- (!) 110 15 99 % -- --  04/14/22 1500 105/61 -- -- (!) 110 18 100 % -- --  04/14/22 1455 (!) 84/64 -- -- (!) 103 13 -- -- --  04/14/22 1448 (!) 111/57 97.6 F (36.4 C) Axillary (!) 112 (!) 21 100 % -- --  04/14/22 1445 (!) 111/57 -- -- -- -- -- -- --  04/14/22 1221 120/75 98.5 F (36.9 C) Oral 88 18 100 % 5\' 3"  (1.6 m) 88 kg  04/14/22 1215 -- -- -- (!) 118 -- 99 % -- --     Physical  Exam:  General: alert, cooperative, and no distress Lochia: appropriate Uterine Fundus: firm Incision: covered with clean/dry pressure dressing DVT Evaluation:   Recent Labs    04/14/22 1910 04/15/22 0520  HGB 13.7 12.3  HCT 37.7 34.9*    Assessment/Plan:  Megan Fields POD#1 sp repeat cesarean at [redacted]w[redacted]d, complicated by acute blood loss anemia/hypotension requiring exploratory laparotomy and [place] by IR 1. PPC: routine PP care, d/c foley and follow up void. Regular diet.  2. Acute blood loss anemia: cesarean EBL 679mL, postoperative hemoglobin 6.2 (from preop 12.6). EBL from exploratory laparotomy 81mL. She received 4U PRBC, 2U platelets, 1U FFP. Ex lap did not reveal source of bleeding. She underwent [place] by IR. Hemoglobin stable this AM (12.3) and patient clinically stable. Discussed with Dr. Terri Piedra this AM, suspicious for retroperitoneal hematoma. Will order CT scan today to assess.  3. Hypokalemia: IV potassium repletion running  Rowland Lathe 04/15/2022, 8:51 AM

## 2022-04-16 ENCOUNTER — Encounter (HOSPITAL_COMMUNITY): Payer: Self-pay | Admitting: Obstetrics and Gynecology

## 2022-04-16 LAB — TYPE AND SCREEN
ABO/RH(D): B POS
Antibody Screen: NEGATIVE
Unit division: 0
Unit division: 0
Unit division: 0
Unit division: 0
Unit division: 0
Unit division: 0
Unit division: 0
Unit division: 0
Unit division: 0
Unit division: 0

## 2022-04-16 LAB — CBC
HCT: 36.5 % (ref 36.0–46.0)
Hemoglobin: 12.8 g/dL (ref 12.0–15.0)
MCH: 31.4 pg (ref 26.0–34.0)
MCHC: 35.1 g/dL (ref 30.0–36.0)
MCV: 89.7 fL (ref 80.0–100.0)
Platelets: 178 10*3/uL (ref 150–400)
RBC: 4.07 MIL/uL (ref 3.87–5.11)
RDW: 13.6 % (ref 11.5–15.5)
WBC: 11 10*3/uL — ABNORMAL HIGH (ref 4.0–10.5)
nRBC: 0 % (ref 0.0–0.2)

## 2022-04-16 LAB — BPAM RBC
Blood Product Expiration Date: 202402152359
Blood Product Expiration Date: 202402162359
Blood Product Expiration Date: 202402162359
Blood Product Expiration Date: 202402172359
Blood Product Expiration Date: 202402172359
Blood Product Expiration Date: 202402252359
Blood Product Expiration Date: 202402272359
Blood Product Expiration Date: 202402282359
Blood Product Expiration Date: 202402282359
Blood Product Expiration Date: 202402282359
ISSUE DATE / TIME: 202401291435
ISSUE DATE / TIME: 202401291435
ISSUE DATE / TIME: 202401291546
ISSUE DATE / TIME: 202401291546
ISSUE DATE / TIME: 202401300935
ISSUE DATE / TIME: 202401301545
Unit Type and Rh: 7300
Unit Type and Rh: 7300
Unit Type and Rh: 7300
Unit Type and Rh: 7300
Unit Type and Rh: 7300
Unit Type and Rh: 7300
Unit Type and Rh: 7300
Unit Type and Rh: 7300
Unit Type and Rh: 7300
Unit Type and Rh: 7300

## 2022-04-16 LAB — COMPREHENSIVE METABOLIC PANEL
ALT: 25 U/L (ref 0–44)
AST: 39 U/L (ref 15–41)
Albumin: 2.1 g/dL — ABNORMAL LOW (ref 3.5–5.0)
Alkaline Phosphatase: 79 U/L (ref 38–126)
Anion gap: 11 (ref 5–15)
BUN: 6 mg/dL (ref 6–20)
CO2: 21 mmol/L — ABNORMAL LOW (ref 22–32)
Calcium: 8.2 mg/dL — ABNORMAL LOW (ref 8.9–10.3)
Chloride: 104 mmol/L (ref 98–111)
Creatinine, Ser: 0.83 mg/dL (ref 0.44–1.00)
GFR, Estimated: >60 mL/min (ref >60)
Glucose, Bld: 90 mg/dL (ref 70–99)
Potassium: 3.3 mmol/L — ABNORMAL LOW (ref 3.5–5.1)
Sodium: 136 mmol/L (ref 135–145)
Total Bilirubin: 1.1 mg/dL (ref 0.3–1.2)
Total Protein: 4.8 g/dL — ABNORMAL LOW (ref 6.5–8.1)

## 2022-04-16 MED ORDER — POTASSIUM CHLORIDE 20 MEQ PO PACK
20.0000 meq | PACK | Freq: Two times a day (BID) | ORAL | Status: DC
  Administered 2022-04-16 – 2022-04-17 (×3): 20 meq via ORAL
  Filled 2022-04-16 (×4): qty 1

## 2022-04-16 NOTE — Progress Notes (Signed)
Subjective: Postpartum Day 2: Cesarean Delivery Megan Fields is feeling well this morning without complaints. Pain is controlled. Denies lightheadedness or dizziness. Denies N/V. Foley out, has spontaneously voided as well as passed flatus and BM. Happy to hear about stable H/H and neg CT results. Does note baby girl needs an MRI 2/2 enlarged head circumference  Objective: Patient Vitals for the past 24 hrs:  BP Temp Temp src Pulse Resp SpO2  04/16/22 0814 126/77 97.6 F (36.4 C) Oral 74 14 95 %  04/16/22 0405 106/60 97.8 F (36.6 C) Oral 79 17 96 %  04/15/22 2332 103/73 97.7 F (36.5 C) Oral 79 18 97 %  04/15/22 1937 107/74 98.8 F (37.1 C) Oral 89 18 97 %  04/15/22 1611 111/76 -- -- 95 -- --  04/15/22 1609 -- -- -- -- -- 99 %  04/15/22 1608 106/68 -- -- 83 -- --  04/15/22 1606 115/64 -- -- 84 -- --  04/15/22 1604 -- -- -- -- -- 96 %  04/15/22 1600 107/78 (!) 97.5 F (36.4 C) Oral 86 17 97 %      Physical Exam:  General: alert, cooperative, and no distress Lochia: appropriate Uterine Fundus: firm Incision: covered with clean/dry pressure dressing DVT Evaluation:   Recent Labs    04/15/22 0520 04/16/22 0609  HGB 12.3 12.8  HCT 34.9* 36.5     Assessment/Plan:  Megan Fields [id number] POD#2 sp repeat cesarean at [redacted]w[redacted]d, complicated by acute blood loss anemia/hypotension requiring exploratory laparotomy and [place] by IR 1. PPC: routine PP care 2. Acute blood loss anemia: cesarean EBL 666mL, postoperative hemoglobin 6.2 (from preop 12.6). EBL from exploratory laparotomy 67mL. She received 4U PRBC, 2U platelets, 1U FFP. Ex lap did not reveal source of bleeding. She underwent [place] by IR. Hemoglobin stable this AM (12.3) and patient clinically stable. Neg CT A/P on POD#1 (no evidence of retroperitoneal hematoma). Meeting postop milestones.   3. Hypokalemia: 2.9 > 3.3 after Iv potassium runs, 4 doses of HCL ordered PO 4. Newborn care: peds following closely. Scheduled for MRI today and  genetics consult: From peds note 1/30: "Genetics consulted on 1/30 who will see patient on 1/31. Neurology consulted who recommended brain MRI w/o contrast (ordered for 1/31) and daily head circumferences."   Megan Fields Megan Fields 04/16/2022, 9:35 AM

## 2022-04-16 NOTE — Lactation Note (Addendum)
This note was copied from a baby's chart. Lactation Consultation Note  Patient Name: Megan Fields [id number] Date: 04/16/2022   Age:41 hours  P3, 39.[redacted] weeks gestation age, [facility] Feeding plan: mother wants to exclusively pump and feed by bottle.   Mother states she is starting to feel a little better and reports she pumped once this morning. She is asking how to establish her milk supply. She reports that she attempted to breast feed her last child but stopped due to sore nipples. She said her milk volume did come in "rather quickly postpartum".   Mother had a postpartum hemorrhage with this delivery, which can delay milk production. Advised mother to pump 8 times/24 hours for 15 minutes in the initiation phase (every 3 hours) to stimulate milk production, drink fluids and place baby skin to skin and rest when baby is sleeping.   Mother /baby anticipated to be discharged to home tomorrow. Will offer to make a referral to OP LC for follow up call regarding milk volume.    Feeding Nipple Type: Slow - flow  Interventions DEBP  Discharge    Consult Status  Follow up 04/17/2022 Inpatient    Gwenevere Abbot 04/16/2022, 1:17 PM

## 2022-04-16 NOTE — Progress Notes (Signed)
In to round, patient in bathroom, RN in room, infant sleeping. Reviewed H/H stable, K now 3.3. Oral Kcl ordered, will reassess when patient done in bathroom BP 126/77 (BP Location: Left Arm)   Pulse 74   Temp 97.6 F (36.4 C) (Oral)   Resp 14   Ht 5\' 3"  (1.6 m)   Wt 88 kg   SpO2 95%   Breastfeeding Unknown   BMI 34.37 kg/m  Of note, HTN meds held given normotensive pressure

## 2022-04-17 MED ORDER — OXYCODONE HCL 5 MG PO TABS: 5.0000 mg | ORAL_TABLET | Freq: Four times a day (QID) | ORAL | 0 refills | Status: AC | PRN

## 2022-04-17 NOTE — Plan of Care (Signed)
  Problem: Education: Goal: Knowledge of General Education information will improve Description: Including pain rating scale, medication(s)/side effects and non-pharmacologic comfort measures Outcome: Adequate for Discharge   Problem: Health Behavior/Discharge Planning: Goal: Ability to manage health-related needs will improve Outcome: Adequate for Discharge   Problem: Clinical Measurements: Goal: Ability to maintain clinical measurements within normal limits will improve Outcome: Adequate for Discharge Goal: Will remain free from infection Outcome: Adequate for Discharge Goal: Diagnostic test results will improve Outcome: Adequate for Discharge Goal: Respiratory complications will improve Outcome: Adequate for Discharge Goal: Cardiovascular complication will be avoided Outcome: Adequate for Discharge   Problem: Safety: Goal: Ability to remain free from injury will improve Outcome: Adequate for Discharge   Problem: Skin Integrity: Goal: Risk for impaired skin integrity will decrease Outcome: Adequate for Discharge   Problem: Education: Goal: Understanding of sexual limitations or changes related to disease process or condition will improve Outcome: Adequate for Discharge Goal: Individualized Educational Video(s) Outcome: Adequate for Discharge   Problem: Self-Concept: Goal: Communication of feelings regarding changes in body function or appearance will improve Outcome: Adequate for Discharge   Problem: Skin Integrity: Goal: Demonstration of wound healing without infection will improve Outcome: Adequate for Discharge   Problem: Education: Goal: Knowledge of condition will improve Outcome: Adequate for Discharge Goal: Individualized Educational Video(s) Outcome: Adequate for Discharge Goal: Individualized Newborn Educational Video(s) Outcome: Adequate for Discharge   Problem: Activity: Goal: Will verbalize the importance of balancing activity with adequate rest  periods Outcome: Adequate for Discharge Goal: Ability to tolerate increased activity will improve Outcome: Adequate for Discharge   Problem: Coping: Goal: Ability to identify and utilize available resources and services will improve Outcome: Adequate for Discharge   Problem: Life Cycle: Goal: Chance of risk for complications during the postpartum period will decrease Outcome: Adequate for Discharge   Problem: Role Relationship: Goal: Ability to demonstrate positive interaction with newborn will improve Outcome: Adequate for Discharge   Problem: Skin Integrity: Goal: Demonstration of wound healing without infection will improve Outcome: Adequate for Discharge   Problem: Education: Goal: Understanding of CV disease, CV risk reduction, and recovery process will improve Outcome: Adequate for Discharge Goal: Individualized Educational Video(s) Outcome: Adequate for Discharge   Problem: Activity: Goal: Ability to return to baseline activity level will improve Outcome: Adequate for Discharge   Problem: Cardiovascular: Goal: Ability to achieve and maintain adequate cardiovascular perfusion will improve Outcome: Adequate for Discharge Goal: Vascular access site(s) Level 0-1 will be maintained Outcome: Adequate for Discharge   Problem: Health Behavior/Discharge Planning: Goal: Ability to safely manage health-related needs after discharge will improve Outcome: Adequate for Discharge

## 2022-04-17 NOTE — Lactation Note (Signed)
This note was copied from a baby's chart. Lactation Consultation Note  Patient Name: Girl Doxie Augenstein [id number] Date: 04/17/2022 Reason for consult: Follow-up assessment;Term Age:41 hours  Mother is pumping and bottle feeding 30-60 ml. Encouraged to continue pumped 8 times per day to stimulate her milk supply. Reviewed engorgement care and monitoring voids/stools. Mother states she will call if she decided she wants OP appt.  Maternal Data Does the patient have breastfeeding experience prior to this delivery?: Yes  Feeding Mother's Current Feeding Choice: Breast Milk and Formula Nipple Type: Slow - flow  Lactation Tools Discussed/Used Tools: Pump;Flanges Pumping frequency: q 3 hours Pumped volume:  (drops)  Interventions Interventions: DEBP;Education  Discharge Discharge Education: Engorgement and breast care;Warning signs for feeding baby Pump: Personal;DEBP  Consult Status Consult Status: Complete Date: 04/17/22    Vivianne Master [facility] 04/17/2022, 3:17 PM

## 2022-04-17 NOTE — Discharge Summary (Signed)
Postpartum Discharge Summary     Patient Name: Megan Fields DOB: October 27, 1980 MRN: [id number]  Date of admission: 04/14/2022 Delivery date:04/14/2022  Delivering provider: Carlynn Purl [facility]  Date of discharge: 04/17/2022  Admitting diagnosis: schedule repeat cesarean section Intrauterine pregnancy: [redacted]w[redacted]d     Secondary diagnosis: AMA, CHTN, GDMA2, asthma Additional problems: none    Discharge diagnosis: Term Pregnancy Delivered, CHTN, GDM A2, and PPH                                              Post partum procedures:blood transfusion and Uterine artery embolization Augmentation: N/A Complications: [id number]  Hospital course: Sceduled C/S   42 y.o. yo [id number] at [redacted]w[redacted]d was admitted to the hospital 04/14/2022 for scheduled cesarean section with the following indication:Elective Repeat.Delivery details are as follows:  Membrane Rupture Time/Date: 1:37 PM ,04/14/2022   Delivery Method:C-Section, Vacuum Assisted  Details of operation can be found in separate operative note.  Patient had a postpartum course complicated by hypotension and tachycardia in PACU.  Was returned to OR for exploratory laparotomy, source of bleeding was not identified, she received methergine and cytotec and decision was made to proceed with [place].  She received 4U PEBCs, 2U platelets, 1U FFP, with preop Hb of 12.6 dropping to low of 6.2 with rebound to 12.3 post transfusion.  She was also treated with supplemental K+ for hypokalemia.  CT scan post op did not reveal intraabdominal or pelvic hematoma. She did well postoperative and at time of discharge she is ambulating, tolerating a regular diet, passing flatus, and urinating well. Patient is discharged home in stable condition on  04/17/22        Newborn Data: Birth date:04/14/2022  Birth time:1:38 PM  Gender:Female  Living status:Living  Apgars:8 ,8  Weight:3550 g     Magnesium Sulfate received: No BMZ received:  No Rhophylac:No  Transfusion:Yes  Physical exam  Vitals:   04/16/22 1605 04/16/22 1927 04/16/22 2325 04/17/22 1145  BP: 117/73 114/79 96/74 122/82  Pulse: 84 74 72 74  Resp: 12 16 16 17   Temp:  98.8 F (37.1 C) 97.6 F (36.4 C) 99.2 F (37.3 C)  TempSrc:  Oral Oral Oral  SpO2: 97% 99% 97% 96%  Weight:      Height:       General: alert, cooperative, and no distress Lochia: appropriate Uterine Fundus: firm Incision: Healing well with no significant drainage, No significant erythema DVT Evaluation: No evidence of DVT seen on physical exam. Labs: Lab Results  Component Value Date   WBC 11.0 (H) 04/16/2022   HGB 12.8 04/16/2022   HCT 36.5 04/16/2022   MCV 89.7 04/16/2022   PLT 178 04/16/2022      Latest Ref Rng & Units 04/16/2022    6:09 AM  CMP  Glucose 70 - 99 mg/dL 90   BUN 6 - 20 mg/dL 6   Creatinine 0.44 - 1.00 mg/dL 0.83   Sodium 135 - 145 mmol/L 136   Potassium 3.5 - 5.1 mmol/L 3.3   Chloride 98 - 111 mmol/L 104   CO2 22 - 32 mmol/L 21   Calcium 8.9 - 10.3 mg/dL 8.2   Total Protein 6.5 - 8.1 g/dL 4.8   Total Bilirubin 0.3 - 1.2 mg/dL 1.1   Alkaline Phos 38 - 126 U/L 79   AST 15 - 41 U/L 39  ALT 0 - 44 U/L 25    Edinburgh Score:    04/16/2022   12:53 PM  Edinburgh Postnatal Depression Scale Screening Tool  I have been able to laugh and see the funny side of things. 0  I have looked forward with enjoyment to things. 0  I have blamed myself unnecessarily when things went wrong. 2  I have been anxious or worried for no good reason. 1  I have felt scared or panicky for no good reason. 0  Things have been getting on top of me. 0  I have been so unhappy that I have had difficulty sleeping. 0  I have felt sad or miserable. 1  I have been so unhappy that I have been crying. 1  The thought of harming myself has occurred to me. 0  Edinburgh Postnatal Depression Scale Total 5      After visit meds:  Allergies as of 04/17/2022       Reactions   Shellfish  Allergy Anaphylaxis        Medication List     STOP taking these medications    albuterol 108 (90 Base) MCG/ACT inhaler Commonly known as: VENTOLIN HFA   aspirin EC 81 MG tablet   docusate sodium 100 MG capsule Commonly known as: COLACE   hydrochlorothiazide 25 MG tablet Commonly known as: HYDRODIURIL   hydrocortisone 2.5 % cream   ipratropium 17 MCG/ACT inhaler Commonly known as: ATROVENT HFA   metFORMIN 500 MG 24 hr tablet Commonly known as: GLUCOPHAGE-XR   montelukast 10 MG tablet Commonly known as: SINGULAIR   polyethylene glycol 17 g packet Commonly known as: MIRALAX / GLYCOLAX   prenatal multivitamin Tabs tablet   triamcinolone cream 0.1 % Commonly known as: KENALOG       TAKE these medications    oxyCODONE 5 MG immediate release tablet Commonly known as: Oxy IR/ROXICODONE Take 1 tablet (5 mg total) by mouth every 6 (six) hours as needed for severe pain.               Discharge Care Instructions  (From admission, onward)           Start     Ordered   04/17/22 0000  Discharge wound care:       Comments: Can remove dressing and steri strips 5 days from date of surgery   04/17/22 1226             Discharge home in stable condition  Infant Disposition:home with mother Discharge instruction: per After Visit Summary and Postpartum booklet. Activity: Advance as tolerated. Pelvic rest for 6 weeks.  Diet: routine diet Anticipated Birth Control: Unsure Postpartum Appointment:1 week Additional Postpartum F/U: Postpartum Depression checkup and BP check : pt to call office to schedule within 1 week Future Appointments:No future appointments. Follow up Visit:  [place], [place], DO. Call in 1 week(s).   Specialty: Obstetrics and Gynecology Contact information: [address] [place]  [place] [address] [phone]                     04/17/2022 Allyn Kenner, DO

## 2022-04-17 NOTE — Progress Notes (Signed)
   04/17/22 1749  Departure Condition  Departure Condition Good  Mobility at [facility]  Patient/Caregiver Teaching Teach Back Method Used;Discharge instructions reviewed;Prescriptions reviewed;Follow-up care reviewed;Pain management discussed;Medications discussed;Patient/caregiver verbalized understanding;Educated about hypertension in pregnancy  Departure Mode With family  Was procedural sedation performed on this patient during this visit? Yes   Patient alert and oriented x4, ambulatory, VS and pain stable.

## 2022-04-17 NOTE — Plan of Care (Signed)
  Problem: Activity: Goal: Risk for activity intolerance will decrease Outcome: Completed/Met   Problem: Nutrition: Goal: Adequate nutrition will be maintained Outcome: Completed/Met   Problem: Coping: Goal: Level of anxiety will decrease Outcome: Completed/Met   Problem: Elimination: Goal: Will not experience complications related to bowel motility Outcome: Completed/Met Goal: Will not experience complications related to urinary retention Outcome: Completed/Met   Problem: Pain Managment: Goal: General experience of comfort will improve Outcome: Completed/Met   Problem: Education: Goal: Knowledge of the prescribed therapeutic regimen will improve Outcome: Completed/Met

## 2022-04-18 NOTE — Anesthesia Preprocedure Evaluation (Signed)
Anesthesia Evaluation  Patient identified by MRN, date of birth, ID band Patient awake    Reviewed: Allergy & Precautions, H&P , NPO status , Patient's Chart, lab work & pertinent test results  Airway Mallampati: I       Dental no notable dental hx.    Pulmonary asthma    Pulmonary exam normal        Cardiovascular hypertension, Pt. on medications Normal cardiovascular exam     Neuro/Psych  PSYCHIATRIC DISORDERS Anxiety Depression       GI/Hepatic negative GI ROS, Neg liver ROS,,,  Endo/Other  diabetes, Gestational, Oral Hypoglycemic Agents    Renal/GU negative Renal ROS  negative genitourinary   Musculoskeletal negative musculoskeletal ROS (+)    Abdominal Normal abdominal exam  (+)   Peds  Hematology negative hematology ROS (+)   Anesthesia Other Findings   Reproductive/Obstetrics (+) Pregnancy                              Anesthesia Physical Anesthesia Plan  ASA: II and emergent  Anesthesia Plan: General   Post-op Pain Management:    Induction: Intravenous  PONV Risk Score and Plan: 3 and Treatment may vary due to age or medical condition, Ondansetron, Dexamethasone and Midazolam  Airway Management Planned: Oral ETT  Additional Equipment: None  Intra-op Plan:   Post-operative Plan: Extubation in OR  Informed Consent: I have reviewed the patients History and Physical, chart, labs and discussed the procedure including the risks, benefits and alternatives for the proposed anesthesia with the patient or authorized representative who has indicated his/her understanding and acceptance.       Plan Discussed with: CRNA  Anesthesia Plan Comments:          Anesthesia Quick Evaluation

## 2022-04-18 NOTE — Anesthesia Postprocedure Evaluation (Signed)
Anesthesia Post Note  Patient: Megan Fields  Procedure(s) Performed: CESAREAN SECTION     Patient location during evaluation: PACU Anesthesia Type: Spinal Level of consciousness: awake Pain management: pain level controlled Vital Signs Assessment: vitals unstable Respiratory status: spontaneous breathing and patient connected to nasal cannula oxygen Cardiovascular status: unstable and tachycardic Postop Assessment: no apparent nausea or vomiting Anesthetic complications: no  No notable events documented.  Last Vitals:  Vitals:   04/17/22 1145 04/17/22 1700  BP: 122/82 123/82  Pulse: 74 78  Resp: 17   Temp: 37.3 C   SpO2: 96%     Last Pain:  Vitals:   04/17/22 1535  TempSrc:   PainSc: 3    Pain Goal: Patients Stated Pain Goal: 3 (04/16/22 1927)                 [name]

## 2022-04-24 ENCOUNTER — Telehealth (HOSPITAL_COMMUNITY): Payer: Self-pay | Admitting: *Deleted

## 2022-04-24 NOTE — Telephone Encounter (Signed)
Hospital Discharge Follow-Up Call:  Patient reports that she is doing OK.  She says her incision is healing well.  She asked about how to remove adhesive still on her skin from her bandages.  I suggested using rubbing alcohol and/or baby oil to remove the adhesive.    She had a visit with her OB, Dr. Terri Piedra, today.  Her BP was elevated, but labs were normal, so she was started on an antihypertensive and will monitor her BPs carefully.  They also discussed that she is feeling overwhelmed and Dr. Terri Piedra prescribed a medicine to help with mood.  We did not complete the EPDS today because she is actively working with her physician for treatment.  Patient says that baby is well and she has no concerns about baby's health.  She reports that baby sleeps in a bedside sleeper.  Reviewed ABCs of Safe Sleep.

## 2022-07-04 ENCOUNTER — Encounter (HOSPITAL_COMMUNITY): Payer: Self-pay | Admitting: Obstetrics and Gynecology

## 2022-12-08 ENCOUNTER — Ambulatory Visit: Payer: Managed Care, Other (non HMO) | Admitting: Allergy and Immunology

## 2024-03-24 ENCOUNTER — Ambulatory Visit (HOSPITAL_BASED_OUTPATIENT_CLINIC_OR_DEPARTMENT_OTHER): Admitting: Family Medicine

## 2024-03-24 ENCOUNTER — Encounter (HOSPITAL_BASED_OUTPATIENT_CLINIC_OR_DEPARTMENT_OTHER): Payer: Self-pay | Admitting: Family Medicine

## 2024-03-24 VITALS — BP 127/94 | HR 98 | Temp 99.6°F | Resp 16 | Ht 63.39 in | Wt 213.3 lb

## 2024-03-24 DIAGNOSIS — E6609 Other obesity due to excess calories: Secondary | ICD-10-CM | POA: Insufficient documentation

## 2024-03-24 DIAGNOSIS — S99829A Other specified injuries of unspecified foot, initial encounter: Secondary | ICD-10-CM | POA: Insufficient documentation

## 2024-03-24 DIAGNOSIS — Z6835 Body mass index (BMI) 35.0-35.9, adult: Secondary | ICD-10-CM | POA: Diagnosis not present

## 2024-03-24 DIAGNOSIS — E66812 Obesity, class 2: Secondary | ICD-10-CM | POA: Diagnosis not present

## 2024-03-24 DIAGNOSIS — I10 Essential (primary) hypertension: Secondary | ICD-10-CM

## 2024-03-24 DIAGNOSIS — Z23 Encounter for immunization: Secondary | ICD-10-CM | POA: Diagnosis not present

## 2024-03-24 NOTE — Progress Notes (Signed)
 Patient is in office today for a nurse visit for Immunization. Patient Injection was given in the  Left deltoid. Patient tolerated injection well.

## 2024-03-24 NOTE — Assessment & Plan Note (Signed)
 Managed with hydrochlorothiazide  25 mg.  - Rechecked blood pressure - Will obtain copies of her recent labs.  DASH diet. - Will consider adding low-dose amlodipine if potassium levels are low or if blood pressure control is inadequate

## 2024-03-24 NOTE — Assessment & Plan Note (Signed)
 Toenail disorder (possible onychomycosis vs. trauma) Possible onychomycosis or trauma-related changes in toenail. Differential includes fungal infection versus traumatic changes.  Antifungal treatment discussed but not preferred due to potential liver stress and uncertainty of fungal presence. - Will consider referral to podiatrist for further evaluation Orders:   Ambulatory referral to Podiatry

## 2024-03-24 NOTE — Assessment & Plan Note (Signed)
 Weight gain post-pregnancy. Interest in weight loss program. Discussion of potential weight loss injections versus pills. Need for blood work, including thyroid function tests, before considering weight loss medication. Emphasis on dietary modifications and exercise. - Will obtain blood work including thyroid function tests - Will consider weight loss injections after reviewing blood work - Encouraged dietary modifications and regular exercise -She declines referral to a dietitian for now.

## 2024-03-24 NOTE — Progress Notes (Signed)
 "  New Patient Office Visit  Subjective    Patient ID: Megan Fields, female    DOB: [birth year]  Age: 44 y.o. MRN: [id number]  CC:  Chief Complaint  Patient presents with   Establish Care    Establish Care     Discussed the use of AI scribe software for clinical note transcription with the patient, who gave verbal consent to proceed.  History of Present Illness Megan Fields is a 44 year old female with hypertension who presents to establish care and address concerns about blood pressure, a possible fungal infection on her toe, and weight management.  She has been taking hydrochlorothiazide  25 mg for hypertension but has not seen a doctor in a year. She is concerned about her blood pressure stability and is unsure if her potassium levels were checked in her last blood work done in November.  She notes a possible fungal infection on her big toe, with a change in the toenail appearance. She recalls scraping her toes at a water  park over the summer and experiencing pain after getting her toes done on vacation. The toe is not currently painful.  She is interested in discussing weight loss options, having gained weight post-pregnancy and struggling to lose it despite efforts. She acknowledges 'cheating' on her diet, particularly with juice, which she normally does not consume.  Her past medical history includes gestational diabetes, which resolved post-pregnancy, and a history of depression and anxiety, though she currently does not feel depressed or anxious. She has experienced asthma that flares up occasionally, particularly during pregnancy, and has had recent episodes of wheezing at night.    Outpatient Encounter Medications as of 03/24/2024  Medication Sig   cetirizine (ZYRTEC) 10 MG tablet Take 10 mg by mouth.   fluticasone (FLONASE) 50 MCG/ACT nasal spray Place 2 sprays into the nose.   hydrochlorothiazide  (HYDRODIURIL ) 25 MG tablet Take 25 mg by mouth every morning.   oxyCODONE  (OXY  IR/ROXICODONE ) 5 MG immediate release tablet Take 1 tablet (5 mg total) by mouth every 6 (six) hours as needed for severe pain. (Patient not taking: Reported on 03/24/2024)   No facility-administered encounter medications on file as of 03/24/2024.    Past Medical History:  Diagnosis Date   Allergies    Anxiety    Asthma    Mild   Cervical cancer screening    f/by GYN   Gestational diabetes    Hypertension    Obesity     Past Surgical History:  Procedure Laterality Date   CESAREAN SECTION N/A 09/29/2017   Procedure: CESAREAN SECTION;  Surgeon: Delana Ted Morrison, DO;  Location: [facility];  Service: Obstetrics;  Laterality: N/A;  Heather  RNFA   CESAREAN SECTION N/A 04/14/2022   Procedure: CESAREAN SECTION;  Surgeon: Delana Ted Morrison, DO;  Location: [facility];  Service: Obstetrics;  Laterality: N/A;  request fellow to assist   CYST REMOVAL TRUNK Right 12/1996   Boil/cyst removed right at side at waist line   DILATION AND CURETTAGE OF UTERUS     DILATION AND EVACUATION N/A 05/21/2016   Procedure: DILATATION AND EVACUATION WITH ULTRASOUND GUIDANCE;  Surgeon: Ted Morrison Delana, DO;  Location: [facility];  Service: Gynecology;  Laterality: N/A;   IR ANGIOGRAM PELVIS SELECTIVE OR SUPRASELECTIVE  04/14/2022   IR ANGIOGRAM PELVIS SELECTIVE OR SUPRASELECTIVE  04/14/2022   IR ANGIOGRAM SELECTIVE EACH ADDITIONAL VESSEL  04/14/2022   IR ANGIOGRAM SELECTIVE EACH ADDITIONAL VESSEL  04/14/2022   IR ANGIOGRAM  SELECTIVE EACH ADDITIONAL VESSEL  04/14/2022   IR EMBO ART  VEN HEMORR LYMPH EXTRAV  INC GUIDE ROADMAPPING  04/14/2022   IR US  GUIDE VASC ACCESS LEFT  04/14/2022   IR US  GUIDE VASC ACCESS RIGHT  04/14/2022   LAPAROTOMY N/A 04/14/2022   Procedure: EXPLORATORY LAPAROTOMY;  Surgeon: Delana Ted Morrison, DO;  Location: [facility];  Service: Gynecology;  Laterality: N/A;    Family History  Problem Relation Age of Onset   Learning disabilities Mother    Diabetes type II Mother     Hypertension Mother    Anxiety disorder Father    Depression Father    Breast cancer Maternal Aunt 50    Social History[1]  Review of Systems  Constitutional:  Negative for diaphoresis, fever, malaise/fatigue and weight loss.  Respiratory:  Negative for cough, shortness of breath and wheezing.   Cardiovascular:  Negative for chest pain, palpitations, orthopnea, claudication, leg swelling and PND.        Objective    BP (!) 127/94   Pulse 98   Temp 99.6 F (37.6 C) (Oral)   Resp 16   Ht 5' 3.39 (1.61 m)   Wt 213 lb 4.8 oz (96.8 kg)   LMP 03/18/2024 (Exact Date)   SpO2 96%   Breastfeeding No   BMI 37.33 kg/m   Physical Exam Constitutional:      General: She is not in acute distress.    Appearance: Normal appearance. She is obese.  HENT:     Head: Normocephalic.  Neck:     Vascular: No carotid bruit.  Cardiovascular:     Rate and Rhythm: Normal rate and regular rhythm.     Pulses: Normal pulses.     Heart sounds: Normal heart sounds.  Pulmonary:     Effort: Pulmonary effort is normal.     Breath sounds: Normal breath sounds.  Abdominal:     General: Bowel sounds are normal.     Palpations: Abdomen is soft.  Musculoskeletal:     Cervical back: Neck supple. No tenderness.     Right lower leg: No edema.     Left lower leg: No edema.     Comments: Left great toenail partially torn and discolored.  No bacterial infection.  No obvious fungal infection.  Neurological:     Mental Status: She is alert.         Assessment & Plan:   Assessment & Plan Essential hypertension Managed with hydrochlorothiazide  25 mg.  - Rechecked blood pressure - Will obtain copies of her recent labs.  DASH diet. - Will consider adding low-dose amlodipine if potassium levels are low or if blood pressure control is inadequate    Torn toenail Toenail disorder (possible onychomycosis vs. trauma) Possible onychomycosis or trauma-related changes in toenail. Differential includes  fungal infection versus traumatic changes.  Antifungal treatment discussed but not preferred due to potential liver stress and uncertainty of fungal presence. - Will consider referral to podiatrist for further evaluation Orders:   Ambulatory referral to Podiatry  Class 2 obesity due to excess calories without serious comorbidity with body mass index (BMI) of 35.0 to 35.9 in adult Weight gain post-pregnancy. Interest in weight loss program. Discussion of potential weight loss injections versus pills. Need for blood work, including thyroid function tests, before considering weight loss medication. Emphasis on dietary modifications and exercise. - Will obtain blood work including thyroid function tests - Will consider weight loss injections after reviewing blood work - Encouraged dietary modifications and  regular exercise -She declines referral to a dietitian for now.        Return in about 4 weeks (around 04/21/2024) for chronic follow-up.   Megan Fields., MD     [1]  Social History Tobacco Use   Smoking status: Never   Smokeless tobacco: Never  Vaping Use   Vaping status: Never Used  Substance Use Topics   Alcohol use: Yes    Comment: 1 glass a month, not while preg   Drug use: No   "

## 2024-04-04 ENCOUNTER — Ambulatory Visit: Admitting: Podiatry

## 2024-04-04 DIAGNOSIS — S90212A Contusion of left great toe with damage to nail, initial encounter: Secondary | ICD-10-CM

## 2024-04-04 NOTE — Progress Notes (Signed)
 "   Chief Complaint  Patient presents with   Nail Problem    Left hallux nail was [place] She did not notice how bad untill she had a pedicure. Now,  part of the nail is still torn and thick, not growing correctly, It is no longer painful.  Not diabetic , no anti coag.     Discussed the use of AI scribe software for clinical note transcription with the patient, who gave verbal consent to proceed.  History of Present Illness Megan Fields is a 44 year old female who presents for evaluation of left great toenail changes following trauma.  She sustained trauma to the left hallux toenail when her foot dragged on the ground during a fall at a water  park, resulting in acute pain at the time of injury.  Several weeks later, she developed a pulsatile sensation in the left hallux, which she associated with increased ambulation during a vacation. After receiving a pedicure, she noted dark discoloration of the toenail and increased pain. The pain resolved after removal of toenail polish.  Currently, she denies pain or bleeding but describes the toenail as having an abnormal appearance and a 'bubbly' texture. She has not used topical antifungal agents or other treatments aside from routine nail care and pedicures. She expresses concern regarding the safety of future pedicures and nail polish use.    Past Medical History:  Diagnosis Date   Allergies    Anxiety    Asthma    Mild   Cervical cancer screening    f/by GYN   Gestational diabetes    Hypertension    Obesity    Past Surgical History:  Procedure Laterality Date   CESAREAN SECTION N/A 09/29/2017   Procedure: CESAREAN SECTION;  Surgeon: Delana Ted Morrison, DO;  [facility]: [facility];  Service: Obstetrics;  Laterality: N/A;  Heather  RNFA   CESAREAN SECTION N/A 04/14/2022   Procedure: CESAREAN SECTION;  Surgeon: Delana Ted Morrison, DO;  Location: [facility];  Service:  Obstetrics;  Laterality: N/A;  request fellow to assist   CYST REMOVAL TRUNK Right 12/1996   Boil/cyst removed right at side at waist line   DILATION AND CURETTAGE OF UTERUS     DILATION AND EVACUATION N/A 05/21/2016   Procedure: DILATATION AND EVACUATION WITH ULTRASOUND GUIDANCE;  Surgeon: Ted Morrison Delana, DO;  Location: [facility];  Service: Gynecology;  Laterality: N/A;   IR ANGIOGRAM PELVIS SELECTIVE OR SUPRASELECTIVE  04/14/2022   IR ANGIOGRAM PELVIS SELECTIVE OR SUPRASELECTIVE  04/14/2022   IR ANGIOGRAM SELECTIVE EACH ADDITIONAL VESSEL  04/14/2022   IR ANGIOGRAM SELECTIVE EACH ADDITIONAL VESSEL  04/14/2022   IR ANGIOGRAM SELECTIVE EACH ADDITIONAL VESSEL  04/14/2022   IR EMBO ART  VEN HEMORR LYMPH EXTRAV  INC GUIDE ROADMAPPING  04/14/2022   IR US  GUIDE VASC ACCESS LEFT  04/14/2022   IR US  GUIDE VASC ACCESS RIGHT  04/14/2022   LAPAROTOMY N/A 04/14/2022   Procedure: EXPLORATORY LAPAROTOMY;  Surgeon: Delana Ted Morrison, DO;  Location: [facility];  Service: Gynecology;  Laterality: N/A;   Allergies[1]  Physical Exam EXTREMITIES: Right big toenail appears healthy proximally with distal onycholysis, yellow and brown discoloration, minimal subungual debris.  There is a horizontal ridge approximately 50% towards the eponychium marking the area of injury.  The proximal portion of the nail appears within normal limits.  There is no pain with compression of the nail  Results Nail debridement of left great toe Dystrophic nail plate trimmed and thinned. Residual dried subungual hematoma and bruised nail segment left intact due to tenderness. No active bleeding. Healthy nail growth at proximal nail plate.    Assessment/Plan of Care: 1. 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 Currently, there is healthy proximal nail regrowth with residual dystrophic changes and a transverse ridge consistent with prior trauma. No evidence of active infection or onychomycosis. The dystrophic segment is expected to grow out, with full regrowth potentially requiring up to one year. - Trimmed and thinned the affected nail to reduce risk of catching and improve cosmesis. - Advised her to avoid pedicures and nail polish to prevent further weakening or chemical exposure. - Recommended topical application of tea tree oil or white vinegar as a mild antifungal and nail conditioner using a cotton ball. - Instructed her to monitor for changes, trim or file any lifted or chipped areas as the nail grows out, and avoid aggressive nail care. - Provided education regarding expected timeline for full regrowth (up to one year). - Advised to contact the office for any concerns or changes in the nail.   Megan Fields, DPM, FACFAS [facility]     [address] [place], [place] [address]                Office [phone]  Fax [phone]     [1]  Allergies Allergen Reactions   Shellfish Allergy Anaphylaxis   "

## 2024-04-21 ENCOUNTER — Ambulatory Visit (HOSPITAL_BASED_OUTPATIENT_CLINIC_OR_DEPARTMENT_OTHER): Admitting: Family Medicine

## 2024-05-03 ENCOUNTER — Ambulatory Visit (HOSPITAL_BASED_OUTPATIENT_CLINIC_OR_DEPARTMENT_OTHER): Admitting: Family Medicine
